# Patient Record
Sex: Female | Born: 1935 | Race: White | Hispanic: No | Smoking: Never smoker
Health system: Southern US, Community
[De-identification: ages and names within clinical notes are randomized; demographics above are authoritative.]

## PROBLEM LIST (undated history)

## (undated) DIAGNOSIS — Z8719 Personal history of other diseases of the digestive system: Secondary | ICD-10-CM

## (undated) DIAGNOSIS — K589 Irritable bowel syndrome without diarrhea: Secondary | ICD-10-CM

## (undated) DIAGNOSIS — F411 Generalized anxiety disorder: Secondary | ICD-10-CM

## (undated) DIAGNOSIS — E7800 Pure hypercholesterolemia, unspecified: Secondary | ICD-10-CM

## (undated) DIAGNOSIS — R9389 Abnormal findings on diagnostic imaging of other specified body structures: Secondary | ICD-10-CM

## (undated) DIAGNOSIS — K449 Diaphragmatic hernia without obstruction or gangrene: Secondary | ICD-10-CM

## (undated) DIAGNOSIS — F0280 Dementia in other diseases classified elsewhere without behavioral disturbance: Secondary | ICD-10-CM

## (undated) DIAGNOSIS — I080 Rheumatic disorders of both mitral and aortic valves: Secondary | ICD-10-CM

## (undated) DIAGNOSIS — I209 Angina pectoris, unspecified: Secondary | ICD-10-CM

## (undated) DIAGNOSIS — D126 Benign neoplasm of colon, unspecified: Secondary | ICD-10-CM

## (undated) DIAGNOSIS — G309 Alzheimer's disease, unspecified: Secondary | ICD-10-CM

## (undated) DIAGNOSIS — I2510 Atherosclerotic heart disease of native coronary artery without angina pectoris: Secondary | ICD-10-CM

## (undated) HISTORY — DX: Rheumatic disorders of both mitral and aortic valves: I08.0

## (undated) HISTORY — DX: Abnormal findings on diagnostic imaging of other specified body structures: R93.89

## (undated) HISTORY — DX: Generalized anxiety disorder: F41.1

## (undated) HISTORY — DX: Atherosclerotic heart disease of native coronary artery without angina pectoris: I25.10

## (undated) HISTORY — DX: Irritable bowel syndrome, unspecified: K58.9

## (undated) HISTORY — DX: Benign neoplasm of colon, unspecified: D12.6

## (undated) HISTORY — DX: Dementia in other diseases classified elsewhere, unspecified severity, without behavioral disturbance, psychotic disturbance, mood disturbance, and anxiety: F02.80

## (undated) HISTORY — PX: CATARACT EXTRACTION W/ INTRAOCULAR LENS IMPLANT: SHX1309

## (undated) HISTORY — DX: Alzheimer's disease, unspecified: G30.9

## (undated) HISTORY — DX: Diaphragmatic hernia without obstruction or gangrene: K44.9

## (undated) HISTORY — DX: Pure hypercholesterolemia, unspecified: E78.00

---

## 1963-05-27 HISTORY — PX: APPENDECTOMY: SHX54

## 1963-05-27 HISTORY — PX: ABDOMINAL HYSTERECTOMY: SHX81

## 1988-11-23 ENCOUNTER — Encounter (INDEPENDENT_AMBULATORY_CARE_PROVIDER_SITE_OTHER): Payer: Self-pay | Admitting: *Deleted

## 1988-11-23 LAB — CONVERTED CEMR LAB

## 1997-10-13 ENCOUNTER — Encounter: Admission: RE | Admit: 1997-10-13 | Discharge: 1997-10-13 | Payer: Self-pay | Admitting: Family Medicine

## 1997-10-17 ENCOUNTER — Encounter: Admission: RE | Admit: 1997-10-17 | Discharge: 1997-10-17 | Payer: Self-pay | Admitting: Sports Medicine

## 1997-12-26 ENCOUNTER — Encounter: Admission: RE | Admit: 1997-12-26 | Discharge: 1997-12-26 | Payer: Self-pay | Admitting: Family Medicine

## 1998-03-27 ENCOUNTER — Encounter: Admission: RE | Admit: 1998-03-27 | Discharge: 1998-03-27 | Payer: Self-pay | Admitting: Family Medicine

## 1998-05-26 DIAGNOSIS — I2510 Atherosclerotic heart disease of native coronary artery without angina pectoris: Secondary | ICD-10-CM

## 1998-05-26 HISTORY — DX: Atherosclerotic heart disease of native coronary artery without angina pectoris: I25.10

## 1998-06-01 ENCOUNTER — Encounter: Admission: RE | Admit: 1998-06-01 | Discharge: 1998-06-01 | Payer: Self-pay | Admitting: Family Medicine

## 1998-06-02 ENCOUNTER — Encounter: Payer: Self-pay | Admitting: *Deleted

## 1998-06-02 ENCOUNTER — Inpatient Hospital Stay (HOSPITAL_COMMUNITY): Admission: EM | Admit: 1998-06-02 | Discharge: 1998-06-05 | Payer: Self-pay | Admitting: *Deleted

## 1998-06-04 HISTORY — PX: CORONARY ANGIOPLASTY WITH STENT PLACEMENT: SHX49

## 1998-06-14 ENCOUNTER — Encounter: Admission: RE | Admit: 1998-06-14 | Discharge: 1998-06-14 | Payer: Self-pay | Admitting: Family Medicine

## 2000-01-29 ENCOUNTER — Encounter: Payer: Self-pay | Admitting: *Deleted

## 2000-01-29 ENCOUNTER — Encounter: Admission: RE | Admit: 2000-01-29 | Discharge: 2000-01-29 | Payer: Self-pay | Admitting: *Deleted

## 2000-02-28 ENCOUNTER — Encounter: Admission: RE | Admit: 2000-02-28 | Discharge: 2000-02-28 | Payer: Self-pay | Admitting: Family Medicine

## 2000-03-10 ENCOUNTER — Encounter: Admission: RE | Admit: 2000-03-10 | Discharge: 2000-03-10 | Payer: Self-pay | Admitting: Family Medicine

## 2000-03-25 ENCOUNTER — Encounter: Admission: RE | Admit: 2000-03-25 | Discharge: 2000-03-25 | Payer: Self-pay | Admitting: Family Medicine

## 2000-05-06 ENCOUNTER — Encounter: Admission: RE | Admit: 2000-05-06 | Discharge: 2000-05-06 | Payer: Self-pay | Admitting: Family Medicine

## 2000-06-11 ENCOUNTER — Encounter: Admission: RE | Admit: 2000-06-11 | Discharge: 2000-06-11 | Payer: Self-pay | Admitting: Family Medicine

## 2000-07-10 ENCOUNTER — Encounter: Admission: RE | Admit: 2000-07-10 | Discharge: 2000-07-10 | Payer: Self-pay | Admitting: Family Medicine

## 2000-08-05 ENCOUNTER — Encounter: Admission: RE | Admit: 2000-08-05 | Discharge: 2000-08-05 | Payer: Self-pay | Admitting: Family Medicine

## 2000-09-04 ENCOUNTER — Encounter: Admission: RE | Admit: 2000-09-04 | Discharge: 2000-09-04 | Payer: Self-pay | Admitting: Family Medicine

## 2000-11-03 ENCOUNTER — Encounter: Admission: RE | Admit: 2000-11-03 | Discharge: 2000-11-03 | Payer: Self-pay | Admitting: Family Medicine

## 2001-02-25 ENCOUNTER — Encounter: Admission: RE | Admit: 2001-02-25 | Discharge: 2001-02-25 | Payer: Self-pay | Admitting: Family Medicine

## 2001-03-25 ENCOUNTER — Encounter: Admission: RE | Admit: 2001-03-25 | Discharge: 2001-03-25 | Payer: Self-pay | Admitting: Family Medicine

## 2001-03-29 ENCOUNTER — Encounter: Payer: Self-pay | Admitting: Sports Medicine

## 2001-03-29 ENCOUNTER — Encounter: Admission: RE | Admit: 2001-03-29 | Discharge: 2001-03-29 | Payer: Self-pay | Admitting: Sports Medicine

## 2001-05-05 ENCOUNTER — Encounter: Admission: RE | Admit: 2001-05-05 | Discharge: 2001-05-05 | Payer: Self-pay | Admitting: Family Medicine

## 2001-05-07 ENCOUNTER — Encounter: Admission: RE | Admit: 2001-05-07 | Discharge: 2001-05-07 | Payer: Self-pay | Admitting: Family Medicine

## 2001-09-21 ENCOUNTER — Encounter: Admission: RE | Admit: 2001-09-21 | Discharge: 2001-09-21 | Payer: Self-pay | Admitting: Family Medicine

## 2001-10-21 ENCOUNTER — Encounter: Admission: RE | Admit: 2001-10-21 | Discharge: 2001-10-21 | Payer: Self-pay | Admitting: Family Medicine

## 2001-10-28 ENCOUNTER — Encounter: Admission: RE | Admit: 2001-10-28 | Discharge: 2001-10-28 | Payer: Self-pay

## 2001-11-22 ENCOUNTER — Encounter: Admission: RE | Admit: 2001-11-22 | Discharge: 2001-11-22 | Payer: Self-pay | Admitting: Family Medicine

## 2001-11-23 ENCOUNTER — Encounter: Admission: RE | Admit: 2001-11-23 | Discharge: 2001-11-23 | Payer: Self-pay | Admitting: Family Medicine

## 2001-12-02 ENCOUNTER — Encounter: Admission: RE | Admit: 2001-12-02 | Discharge: 2001-12-02 | Payer: Self-pay | Admitting: Family Medicine

## 2002-04-04 ENCOUNTER — Encounter: Admission: RE | Admit: 2002-04-04 | Discharge: 2002-04-04 | Payer: Self-pay | Admitting: Family Medicine

## 2002-06-01 ENCOUNTER — Encounter: Admission: RE | Admit: 2002-06-01 | Discharge: 2002-06-01 | Payer: Self-pay | Admitting: Family Medicine

## 2005-05-06 ENCOUNTER — Ambulatory Visit: Payer: Self-pay

## 2005-05-09 ENCOUNTER — Ambulatory Visit: Payer: Self-pay | Admitting: Cardiology

## 2005-05-09 ENCOUNTER — Ambulatory Visit: Payer: Self-pay

## 2005-05-09 ENCOUNTER — Encounter: Payer: Self-pay | Admitting: Cardiology

## 2005-05-14 ENCOUNTER — Ambulatory Visit: Payer: Self-pay

## 2005-05-26 HISTORY — PX: INSERT / REPLACE / REMOVE PACEMAKER: SUR710

## 2005-06-06 ENCOUNTER — Ambulatory Visit: Payer: Self-pay | Admitting: Cardiology

## 2005-07-09 ENCOUNTER — Ambulatory Visit: Payer: Self-pay | Admitting: Internal Medicine

## 2005-07-09 ENCOUNTER — Inpatient Hospital Stay (HOSPITAL_COMMUNITY): Admission: EM | Admit: 2005-07-09 | Discharge: 2005-07-12 | Payer: Self-pay | Admitting: Emergency Medicine

## 2005-07-21 ENCOUNTER — Ambulatory Visit: Payer: Self-pay

## 2005-08-19 ENCOUNTER — Ambulatory Visit: Payer: Self-pay | Admitting: Cardiology

## 2005-09-19 ENCOUNTER — Ambulatory Visit: Payer: Self-pay | Admitting: Cardiology

## 2005-09-30 ENCOUNTER — Ambulatory Visit: Payer: Self-pay | Admitting: Internal Medicine

## 2006-01-22 ENCOUNTER — Ambulatory Visit: Payer: Self-pay | Admitting: Internal Medicine

## 2006-02-03 ENCOUNTER — Ambulatory Visit: Payer: Self-pay | Admitting: Internal Medicine

## 2006-02-03 ENCOUNTER — Encounter: Payer: Self-pay | Admitting: Internal Medicine

## 2006-02-03 DIAGNOSIS — D126 Benign neoplasm of colon, unspecified: Secondary | ICD-10-CM

## 2006-02-03 DIAGNOSIS — K648 Other hemorrhoids: Secondary | ICD-10-CM | POA: Insufficient documentation

## 2006-04-08 ENCOUNTER — Ambulatory Visit: Payer: Self-pay | Admitting: Internal Medicine

## 2006-05-08 ENCOUNTER — Ambulatory Visit: Payer: Self-pay | Admitting: Cardiology

## 2006-07-07 ENCOUNTER — Ambulatory Visit: Payer: Self-pay | Admitting: Internal Medicine

## 2006-07-23 DIAGNOSIS — F411 Generalized anxiety disorder: Secondary | ICD-10-CM | POA: Insufficient documentation

## 2006-07-23 DIAGNOSIS — I2510 Atherosclerotic heart disease of native coronary artery without angina pectoris: Secondary | ICD-10-CM | POA: Insufficient documentation

## 2006-07-23 DIAGNOSIS — K589 Irritable bowel syndrome without diarrhea: Secondary | ICD-10-CM

## 2006-07-23 DIAGNOSIS — E7800 Pure hypercholesterolemia, unspecified: Secondary | ICD-10-CM

## 2006-07-23 DIAGNOSIS — K449 Diaphragmatic hernia without obstruction or gangrene: Secondary | ICD-10-CM | POA: Insufficient documentation

## 2006-07-24 ENCOUNTER — Encounter (INDEPENDENT_AMBULATORY_CARE_PROVIDER_SITE_OTHER): Payer: Self-pay | Admitting: *Deleted

## 2006-07-29 ENCOUNTER — Ambulatory Visit: Payer: Self-pay | Admitting: Internal Medicine

## 2007-01-28 ENCOUNTER — Ambulatory Visit: Payer: Self-pay | Admitting: Internal Medicine

## 2007-02-01 ENCOUNTER — Ambulatory Visit: Payer: Self-pay | Admitting: Internal Medicine

## 2007-03-10 ENCOUNTER — Ambulatory Visit: Payer: Self-pay | Admitting: Internal Medicine

## 2007-05-18 ENCOUNTER — Ambulatory Visit: Payer: Self-pay | Admitting: Internal Medicine

## 2007-07-29 DIAGNOSIS — K5900 Constipation, unspecified: Secondary | ICD-10-CM | POA: Insufficient documentation

## 2007-08-23 ENCOUNTER — Ambulatory Visit: Payer: Self-pay | Admitting: Internal Medicine

## 2007-12-06 ENCOUNTER — Ambulatory Visit: Payer: Self-pay | Admitting: Internal Medicine

## 2008-02-11 ENCOUNTER — Ambulatory Visit: Payer: Self-pay | Admitting: Internal Medicine

## 2008-03-28 ENCOUNTER — Ambulatory Visit: Payer: Self-pay | Admitting: Internal Medicine

## 2008-05-17 ENCOUNTER — Ambulatory Visit: Payer: Self-pay | Admitting: Internal Medicine

## 2008-08-09 ENCOUNTER — Ambulatory Visit: Payer: Self-pay | Admitting: Internal Medicine

## 2008-09-15 ENCOUNTER — Encounter (INDEPENDENT_AMBULATORY_CARE_PROVIDER_SITE_OTHER): Payer: Self-pay | Admitting: Radiology

## 2008-11-13 ENCOUNTER — Encounter: Payer: Self-pay | Admitting: Internal Medicine

## 2008-11-16 ENCOUNTER — Ambulatory Visit: Payer: Self-pay | Admitting: Internal Medicine

## 2008-12-07 ENCOUNTER — Encounter: Payer: Self-pay | Admitting: Internal Medicine

## 2009-01-26 DIAGNOSIS — I080 Rheumatic disorders of both mitral and aortic valves: Secondary | ICD-10-CM

## 2009-01-26 DIAGNOSIS — Z87898 Personal history of other specified conditions: Secondary | ICD-10-CM

## 2009-01-30 ENCOUNTER — Ambulatory Visit: Payer: Self-pay | Admitting: Internal Medicine

## 2009-05-10 ENCOUNTER — Encounter: Payer: Self-pay | Admitting: Internal Medicine

## 2009-05-15 ENCOUNTER — Ambulatory Visit: Payer: Self-pay | Admitting: Internal Medicine

## 2009-05-29 ENCOUNTER — Encounter: Payer: Self-pay | Admitting: Internal Medicine

## 2009-08-07 ENCOUNTER — Emergency Department (HOSPITAL_COMMUNITY)
Admission: EM | Admit: 2009-08-07 | Discharge: 2009-08-07 | Payer: Self-pay | Source: Home / Self Care | Admitting: Emergency Medicine

## 2009-09-26 ENCOUNTER — Encounter: Payer: Self-pay | Admitting: Internal Medicine

## 2009-12-11 ENCOUNTER — Encounter: Payer: Self-pay | Admitting: Internal Medicine

## 2009-12-14 ENCOUNTER — Encounter: Payer: Self-pay | Admitting: Internal Medicine

## 2009-12-25 ENCOUNTER — Encounter: Payer: Self-pay | Admitting: Internal Medicine

## 2010-04-23 ENCOUNTER — Ambulatory Visit: Payer: Self-pay | Admitting: Internal Medicine

## 2010-04-25 DIAGNOSIS — R9389 Abnormal findings on diagnostic imaging of other specified body structures: Secondary | ICD-10-CM

## 2010-04-25 HISTORY — DX: Abnormal findings on diagnostic imaging of other specified body structures: R93.89

## 2010-05-11 ENCOUNTER — Encounter: Payer: Self-pay | Admitting: Internal Medicine

## 2010-05-11 ENCOUNTER — Inpatient Hospital Stay (HOSPITAL_COMMUNITY)
Admission: EM | Admit: 2010-05-11 | Discharge: 2010-05-14 | Payer: Self-pay | Source: Home / Self Care | Attending: Internal Medicine | Admitting: Internal Medicine

## 2010-05-12 ENCOUNTER — Encounter: Payer: Self-pay | Admitting: Internal Medicine

## 2010-05-12 LAB — CONVERTED CEMR LAB
LDL Cholesterol: 113 mg/dL
TSH: 2.009 microintl units/mL
Triglyceride fasting, serum: 53 mg/dL

## 2010-05-13 ENCOUNTER — Encounter (INDEPENDENT_AMBULATORY_CARE_PROVIDER_SITE_OTHER): Payer: Self-pay | Admitting: Internal Medicine

## 2010-05-14 ENCOUNTER — Encounter: Payer: Self-pay | Admitting: Internal Medicine

## 2010-05-14 ENCOUNTER — Encounter (INDEPENDENT_AMBULATORY_CARE_PROVIDER_SITE_OTHER): Payer: Self-pay | Admitting: Internal Medicine

## 2010-05-14 LAB — CONVERTED CEMR LAB
CO2: 26 meq/L
Calcium: 8.8 mg/dL
HCT: 43.2 %
Hemoglobin: 14 g/dL
MCV: 92.3 fL
Platelets: 197 10*3/uL
RBC: 4.68 M/uL
WBC: 5.9 10*3/uL

## 2010-06-14 ENCOUNTER — Encounter: Payer: Self-pay | Admitting: Internal Medicine

## 2010-06-17 ENCOUNTER — Ambulatory Visit
Admission: RE | Admit: 2010-06-17 | Discharge: 2010-06-17 | Payer: Self-pay | Source: Home / Self Care | Attending: Internal Medicine | Admitting: Internal Medicine

## 2010-06-17 DIAGNOSIS — G309 Alzheimer's disease, unspecified: Secondary | ICD-10-CM

## 2010-06-17 DIAGNOSIS — F0280 Dementia in other diseases classified elsewhere without behavioral disturbance: Secondary | ICD-10-CM | POA: Insufficient documentation

## 2010-06-25 NOTE — Letter (Signed)
Summary: Remote Device Check  [employer], Main Office  [address]   [place], [place] [address]   Phone: [phone]  Fax: [phone]     May 29, 2009 MRN: [id number]   Breslin Sigg [address] [place], [place]  [address]   Dear Ms. Jurewicz,   Your remote transmission was recieved and reviewed by your physician.  All diagnostics were within normal limits for you.  __X___Your next transmission is scheduled for:  August 15, 2009.  Please transmit at any time this day.  If you have a wireless device your transmission will be sent automatically.     Sincerely,  Proofreader

## 2010-06-25 NOTE — Letter (Signed)
Summary: Remote Device Check  [employer], Main Office  [address]   [place], [place] [address]   Phone: [phone]  Fax: [phone]     December 25, 2009 MRN: [id number]   Anna [name] [address] [place], [place]  [address]   Dear Ms. Curiale,   Your remote transmission was recieved and reviewed by your physician.  All diagnostics were within normal limits for you.   __X____Your next office visit is scheduled for:   September with Dr Graciela Husbands. Please call our office to schedule an appointment.    Sincerely,  Vella Kohler

## 2010-06-25 NOTE — Letter (Signed)
Summary: Device-Delinquent Phone Journalist, newspaper, Main Office  [address]   [place], [place] [address]   Phone: [phone]  Fax: [phone]     December 11, 2009 MRN: [id number]   Geneal Axford [address] [place], [place]  [address]   Dear Ms. Mccleod,  According to our records, you were scheduled for a device phone transmission on  08-15-09.     We did not receive any results from this check.  If you transmitted on your scheduled day, please call us to help troubleshoot your system.  If you forgot to send your transmission, please send one upon receipt of this letter.  Thank you,   Architectural technologist [facility]

## 2010-06-25 NOTE — Assessment & Plan Note (Signed)
Summary: pc2 medtronic  Medications Added ARICEPT 5 MG TABS (DONEPEZIL HCL) once daily      Allergies Added: NKDA  Visit Type:  PPM-Medtronic  CC:  no complaints.  History of Present Illness:   Anna [name] is seen in followup for pacemaker implanted for sinus node dysfunction some years ago.  Echo 2006 demonstratedv normal left ventricular function  Cardiac Cath 2007 demonstrated no obstructive coronary disease and no problems with the previously implanted LAD stent  She is doing well without cardiovascular complaints. She is a history of coronary artery disease and prior PCI; she had no intercurrent problems with chest pain or shortness of breath  She has had a history of recent falls. These have occurred following standing. They also can occur with abrupt motion but there is no dizziness with turning of the head. Potential medications contributing include Aricept; she does not know her other medications. She will call if with this information.  Problems Prior to Update: 1)  Pacemaker Medtronic Ddd  (ICD-V45.01) 2)  Sinus Node Dysfunction  (ICD-427.81) 3)  Coronary, Arteriosclerosis  (ICD-414.00) 4)  Glaucoma, Hx of  (ICD-V13.8) 5)  Mitral Regurgitation  (ICD-396.3) 6)  Constipation  (ICD-564.00) 7)  Internal Hemorrhoids  (ICD-455.0) 8)  Colonic Polyps, Adenomatous  (ICD-211.3) 9)  Irritable Bowel Syndrome  (ICD-564.1) 10)  Hypercholesterolemia  (ICD-272.0) 11)  Hernia, Hiatal, Noncongenital  (ICD-553.3) 12)  Anxiety  (ICD-300.00)  Current Medications (verified): 1)  Simply Saline 0.9 % Aers (Saline) .... Daily As Directed 2)  Nasonex 50 Mcg/act Susp (Mometasone Furoate) .... Two Sprays Daily 3)  Aricept 5 Mg Tabs (Donepezil Hcl) .... Once Daily  Allergies (verified): No Known Drug Allergies  Past History:  Past Medical History: Last updated: 02/16/2009 Burning Mouth Syndrome, h/o BPV, h/o thrombosed hemorrhoids s/p I&D GLAUCOMA, HX OF (ICD-V13.8) MITRAL  REGURGITATION (ICD-396.3) CONSTIPATION (ICD-564.00) INTERNAL HEMORRHOIDS (ICD-455.0) COLONIC POLYPS, ADENOMATOUS (ICD-211.3) IRRITABLE BOWEL SYNDROME (ICD-564.1) HYPERCHOLESTEROLEMIA (ICD-272.0) HERNIA, HIATAL, NONCONGENITAL (ICD-553.3) CORONARY, ARTERIOSCLEROSIS (ICD-414.00) ANXIETY (ICD-300.00)  Past Surgical History: Last updated: 02-16-09 CT - Head 12/93 -, s/p PTCA and stent - 80% LAD 1/00 -, TAH/BSO 1971 - Appendectomy  Hysterectomy  Pacemaker Implantation  Medtronic Adapta  07/11/2005 Duke Salvia, M.D.    Family History: Last updated: 16-Feb-2009  Mother deceased secondary to myocardial infarction.  Father  died of a stroke.  The patient has 9 siblings, some of whom did die from  heart attacks, according to her daughter.  Social History: Last updated: 07/23/2006 Lives alone, no tob, no etoh.  Retired from 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  Vital Signs:  Patient profile:   77 year old female Height:      63 inches Weight:      129 pounds BMI:     22.93 Pulse rate:   72 / minute BP sitting:   143 / 71  (left arm) Cuff size:   regular  Vitals Entered By: Anna [name] CMA (April 23, 2010 10:46 AM)  Physical Exam  General:  The patient was alert and oriented in no acute distress. HEENT Normal.  Neck veins were flat, carotids were brisk.  Lungs were clear.  Heart sounds were regular without murmurs or gallops.  Abdomen was soft with active bowel sounds. There is no clubbing cyanosis or edema. Skin Warm and  dry    PPM Specifications Following MD:  Sherryl Manges, MD     PPM Vendor:  Medtronic     PPM Model Number:  [id number]     PPM Serial Number:  [id number] PPM DOI:  07/11/2005     PPM Implanting MD:  Sherryl Manges, MD  Lead 1     Location: RA     DOI: 07/11/2005     Model #: [id number]     Serial #: [id number]     Status: active Lead 2    Location: RV     DOI: 07/11/2005     Model #: [id number]     Serial #: [id number]     Status: active   Indications:  SND   PPM Follow Up Battery Voltage:  2.77 V     Battery Est. Longevity:  4.5 yrs     Pacer Dependent:  No       PPM Device Measurements Atrium  Amplitude: 2.80 mV, Impedance: 474 ohms, Threshold: 0.250 V at 0.40 msec Right Ventricle  Amplitude: 11.20 mV, Impedance: 516 ohms, Threshold: 1.00 V at 0.40 msec  Episodes MS Episodes:  38     Percent Mode Switch:  <0.1%     Coumadin:  No Ventricular High Rate:  3     Atrial Pacing:  92.1%     Ventricular Pacing:  0.4%  Parameters Mode:  MVP     Lower Rate Limit:  60     Upper Rate Limit:  130 Paced AV Delay:  200     Sensed AV Delay:  170 Next Remote Date:  07/25/2010     Next Cardiology Appt Due:  03/27/2011 Tech Comments:  38 MODE SWITCHES. 3 VHR EPISODES--1:1 CONDUCTION. NORMAL DEVICE FUNCTION.  CHANGED RA OUTPUT FROM 1.5 TO 2.0 V.  CARELINK TRANSMISSION 07-25-10 AND ROV IN 12 MTHS W/SK.   Impression & Recommendations:  Problem # 1:  ORTHOSTATIC DIZZINESS (ICD-458.0) he has orthostatic dizziness. I have reviewed with her a number of maneuvers to try to promote venous return prior to standing. Notably her symptoms are not particularly bad in the early morning. In the event that these are inadequate, medication adjustments can be considered. Aricept is one potential contributor. We need to get a list of other medications to see if there are any other possible culprits. In the absence of smoking guns, we could consider the addition of ProAmatine.  Problem # 2:  CORONARY, ARTERIOSCLEROSIS (ICD-414.00) no chest pain  Problem # 3:  SINUS NODE DYSFUNCTION (ICD-427.81) Stable with 92% atrial pacing  Problem # 4:  PACEMAKER MEDTRONIC DDD (ICD-V45.01) Device parameters and data were reviewed and no changes were made  Patient  Instructions: 1)  Your physician recommends that you continue on your current medications as directed. Please refer to the Current Medication list given to you today. 2)  Your physician wants you to follow-up in: 1 year  You will receive a reminder letter in the mail two months in advance. If you don't receive a letter, please call our office to schedule the follow-up appointment.

## 2010-06-25 NOTE — Letter (Signed)
Summary: Device-Delinquent Phone Journalist, newspaper, Main Office  [address]   [place], [place] [address]   Phone: [phone]  Fax: [phone]     Sep 26, 2009 MRN: [id number]   Lizbet Viruet [address] [place], [place]  [address]   Dear Ms. Crance,  According to our records, you were scheduled for a device phone transmission on 08-15-2009.     We did not receive any results from this check.  If you transmitted on your scheduled day, please call us to help troubleshoot your system.  If you forgot to send your transmission, please send one upon receipt of this letter.  Thank you,   Architectural technologist [facility]

## 2010-06-25 NOTE — Cardiovascular Report (Signed)
Summary: Office Visit Remote   Office Visit Remote   Imported By: Roderic Ovens 12/28/2009 10:46:39  _____________________________________________________________________  External Attachment:    Type:   Image     Comment:   External Document

## 2010-06-25 NOTE — Cardiovascular Report (Signed)
Summary: Office Visit Remote   Office Visit Remote   Imported By: Roderic Ovens 05/31/2009 12:41:37  _____________________________________________________________________  External Attachment:    Type:   Image     Comment:   External Document

## 2010-06-25 NOTE — Cardiovascular Report (Signed)
Summary: Office Visit   Office Visit   Imported By: Roderic Ovens 04/29/2010 16:21:03  _____________________________________________________________________  External Attachment:    Type:   Image     Comment:   External Document

## 2010-06-27 NOTE — Assessment & Plan Note (Signed)
Summary: New / Medicare / # cd   Vital Signs:  Patient profile:   77 year old female Height:      63 inches (160.02 cm) Weight:      121.12 pounds (55.05 kg) O2 Sat:      93 % on Room air Temp:     98.6 degrees F (37.00 degrees C) oral Pulse rate:   90 / minute BP sitting:   122 / 70  (left arm) Cuff size:   regular  Vitals Entered By: Orlan Leavens RMA (June 17, 2010 1:44 PM)  O2 Flow:  Room air CC: New patient Is Patient Diabetic? No Pain Assessment Patient in pain? no        Primary Care Provider:  Newt Lukes MD  CC:  New patient.  History of Present Illness: new pt to me and our division but known to our group, here to est care  1) syncope - hosp for same 04/2010 - dc summary reviewed - cardio and neuro eval and imaging unremarkable -- med changes reviewed (decrease in centrally acting BZ doses) - no recurrence - no HA or CP  2) memory loss - mild-mod dementia, progressive symptoms despite aricept -improved with dc of klonopin and dtr feels pt is doing "fine" - declines neuro eval or further testing but agreeable to inc in meds if it will help same  3) CAD hx - BMS to prox LAD 05/2009 - reports compliance with ongoing medical treatment and no changes in medication dose or frequency. denies adverse side effects related to current therapy. no CP or angina symptoms   4) dyslipidemia - not on rx med for same - follows low fat diet (supplied by family)  5) IBS hx, constip predom - constant concern to pt - reports no OP med tx helps - no abd pain or rectal bleeding - no weight loss or diarrhea    Preventive Screening-Counseling & Management  Alcohol-Tobacco     Alcohol drinks/day: 0     Alcohol Counseling: not indicated; patient does not drink     Smoking Status: never     Tobacco Counseling: not indicated; no tobacco use  Caffeine-Diet-Exercise     Does Patient Exercise: no     Exercise Counseling: to improve exercise regimen     Depression Counseling:  not indicated; screening negative for depression  Safety-Violence-Falls     Seat Belt Counseling: not indicated; patient wears seat belts     Firearm Counseling: not applicable     Violence Counseling: not indicated; no violence risk noted     Fall Risk Counseling: counseling provided; falls with injury noted  Clinical Review Panels:  Prevention   Last Mammogram:  Done. (03/26/2001)   Last Pap Smear:  Done. (11/23/1988)   Last Colonoscopy:  Location:  [facility].  Results: Polyp.  Results: Hemorrhoids.      (02/03/2006)  Immunizations   Last Tetanus Booster:  Done. (05/26/1994)   Last Pneumovax:  Done. (05/27/1995)  Lipid Management   Cholesterol:  189 (05/12/2010)   LDL (bad choesterol):  113 (05/12/2010)   HDL (good cholesterol):  65 (05/12/2010)   Triglycerides:  53 (05/12/2010)  Diabetes Management   HgBA1C:  5.3 (05/11/2010)   Creatinine:  0.78 (05/14/2010)   Last Pneumovax:  Done. (05/27/1995)  CBC   WBC:  5.9 (05/14/2010)   RBC:  4.68 (05/14/2010)   Hgb:  14.0 (05/14/2010)   Hct:  43.2 (05/14/2010)   Platelets:  197 (05/14/2010)   MCV  92.3 (05/14/2010)   RDW  14.1 (05/14/2010)  Complete Metabolic Panel   Glucose:  95 (05/14/2010)   Sodium:  143 (05/14/2010)   Potassium:  3.8 (05/14/2010)   Chloride:  109 (05/14/2010)   CO2:  26 (05/14/2010)   BUN:  11 (05/14/2010)   Creatinine:  0.78 (05/14/2010)   Calcium:  8.8 (05/14/2010)   Current Medications (verified): 1)  Aricept 5 Mg Tabs (Donepezil Hcl) .... Once Daily 2)  Aspirin 81 Mg Tabs (Aspirin) .... Take 1 By Mouth Once Daily 3)  Restoril 7.5 Mg Caps (Temazepam) .... Take 1 At Bedtime 4)  Tranxene-T 3.75 Mg Tabs (Clorazepate Dipotassium) .... Take 1 Every Other Day 5)  One Daily For Women 50+ Adv  Tabs (Multiple Vitamins-Minerals) .... Take 1 By Mouth Once Daily  Allergies (verified): 1)  ! Valium  Past History:  Past Medical History: Burning Mouth Syndrome, h/o BPV, h/o  thrombosed hemorrhoids s/p I&D GLAUCOMA, HX MITRAL REGURGITATION COLONIC POLYPS, ADENOMATOUS  IRRITABLE BOWEL SYNDROME -C HYPERCHOLESTEROLEMIA CORONARY, ARTERIOSCLEROSIS  - BMS prox LAD 05/2009 SSS s/p PPM 06/2005 ANXIETY dementia  MD roster: card - [facility] - brodie  Past Surgical History: CT - Head 12/93 -,  s/p PTCA and stent - 80% LAD 05/1998 -,  TAH/BSO 1971 - Appendectomy Pacemaker Implantation  Medtronic Adapta  07/11/2005 Duke Salvia, M.D.    Family History:  Mother deceased secondary to myocardial infarction.   Father died of a stroke.   The patient has 9 siblings, some of whom died from heart attacks, according to her daughter.  Social History: Lives alone, no tob, no etoh.   Retired from [employer]. Divorced; husband was alcoholic and abandoned her 1950's. 4 children: son in [place], son staying with her intermittently, dtr RN in [place], dtr RN Kristie Cowman cares for child with Down's full-time; Claris Che is designated Management consultant.Smoking Status:  never Does Patient Exercise:  no  Review of Systems       see HPI above. I have reviewed all other systems and they were negative.   Physical Exam  General:  frail older woman, dtr at side - alert, well-developed, well-nourished, and cooperative to examination.    Head:  Normocephalic and atraumatic without obvious abnormalities. No apparent alopecia or balding. Eyes:  vision grossly intact; pupils equal, round and reactive to light.  conjunctiva and lids normal.    Ears:  R ear normal and L ear normal.   Mouth:  teeth and gums in good repair; mucous membranes moist, without lesions or ulcers. oropharynx clear without exudate, no erythema.  Lungs:  normal respiratory effort, no intercostal retractions or use of accessory muscles; normal breath sounds bilaterally - no crackles and no wheezes.    Heart:  normal rate, regular rhythm, no murmur, and no rub. BLE without edema. Abdomen:  soft,  non-tender, normal bowel sounds, no distention; no masses and no appreciable hepatomegaly or splenomegaly.   Neurologic:  alert & oriented X2 and cranial nerves II-XII symetrically intact.  strength normal in all extremities, sensation intact to light touch, and gait normal. speech fluent without dysarthria or aphasia; follows commands with good comprehension.  short term recall 1/3 at 3 minutes Psych:  Oriented X3, memory intact for recent and remote, normally interactive, good eye contact, not anxious appearing, not depressed appearing, and not agitated.   repetitive conversation re: bowels   Impression & Recommendations:  Problem # 1:  ALZHEIMERS DISEASE (ICD-331.0)  progressive memory loss with age - dtr declines further  testing or neuro eval -  improved with dc of klonopin -  will inc doze aricept and consider addl med as needed - erx done pt with 24/7 supervision at her home and no longer driving - commended family for same -  Orders: Prescription Created Electronically [phone])  Problem # 2:  IRRITABLE BOWEL SYNDROME (ICD-564.1) chronic, constipation predom - rec diet changes and miralax daily - prior GI eval reviewed - f/u brodie as needed - exam and hx benign  Problem # 3:  CORONARY, ARTERIOSCLEROSIS (ICD-414.00)  BMS 05/2009 to LAD - cont ASA and med tx - no active symptoms   Her updated medication list for this problem includes:    Aspirin 81 Mg Tabs (Aspirin) .Marland Kitchen... Take 1 by mouth once daily  Labs Reviewed: Chol: 189 (05/12/2010)   HDL: 65 (05/12/2010)   LDL: 113 (05/12/2010)   TG: 53 (05/12/2010)  Problem # 4:  SINUS NODE DYSFUNCTION (ICD-427.81)  Her updated medication list for this problem includes:    Aspirin 81 Mg Tabs (Aspirin) .Marland Kitchen... Take 1 by mouth once daily  Stable with 92% atrial pacing s/p PPM 2007 - EP f/u as needed   Echocardiogram:   SUMMARY   -  Overall left ventricular systolic function was normal. Left         ventricular ejection fraction was  estimated , range being 55         % to 65 %.. There were no left ventricular regional wall         motion abnormalities.   -  Aortic valve thickness was mildly increased. The aortic valve was         mildly calcified.   -  There was mild mitral valvular regurgitation.   -  The estimated peak pulmonary artery systolic pressure was mildly         increased.    ---------------------------------------------------------------   Prepared and Electronically Authenticated by   Olga Millers M.D.   Confirmed 09-May-2005 11:34:26 (05/09/2005)  Labs Reviewed: Na: 143 (05/14/2010)   K+: 3.8 (05/14/2010)   CL: 109 (05/14/2010)   HCO3: 26 (05/14/2010) Ca: 8.8 (05/14/2010)   TSH: 2.009 (05/12/2010)   HCO3: 26 (05/14/2010)  Complete Medication List: 1)  Aricept 10 Mg Tabs (Donepezil hcl) .Marland Kitchen.. 1 by mouth once daily 2)  Aspirin 81 Mg Tabs (Aspirin) .... Take 1 by mouth once daily 3)  Restoril 7.5 Mg Caps (Temazepam) .... Take 1 at bedtime 4)  Tranxene-t 3.75 Mg Tabs (Clorazepate dipotassium) .... Take 1 every other day 5)  One Daily For Women 50+ Adv Tabs (Multiple vitamins-minerals) .... Take 1 by mouth once daily  Patient Instructions: 1)  it was good to see you today. 2)  medications and history reviewed - 3)  use miralax 1-2x/day for bowels - also eat dried prunes and lots of fruit 4)  increase aricept dose for memory - your prescription has been electronically submitted to your pharmacy. Please take as directed. Contact our office if you believe you're having problems with the medication(s).  5)  Please schedule a follow-up appointment in 3-4 months to review memory, call sooner if problems.  Prescriptions: ARICEPT 10 MG TABS (DONEPEZIL HCL) 1 by mouth once daily  #30 x 6   Entered and Authorized by:   Newt Lukes MD   Signed by:   Newt Lukes MD on 06/17/2010   Method used:   Electronically to        [facility]* (retail)  [address]  [address] [place], [place]  [address]       Ph: [phone] or [phone]       Fax: [phone]   RxID:   [id number]    Orders Added: 1)  New Patient Level III [99203] 2)  Prescription Created Electronically [[id number]]     Colonoscopy  Procedure date:  02/03/2006  Findings:      Location:  [facility].  Results: Polyp.  Results: Hemorrhoids.

## 2010-07-28 ENCOUNTER — Encounter (INDEPENDENT_AMBULATORY_CARE_PROVIDER_SITE_OTHER): Payer: Self-pay | Admitting: *Deleted

## 2010-08-05 LAB — COMPREHENSIVE METABOLIC PANEL
Albumin: 3.7 g/dL (ref 3.5–5.2)
Creatinine, Ser: 0.84 mg/dL (ref 0.4–1.2)
GFR calc Af Amer: >60 mL/min (ref >60)
GFR calc non Af Amer: >60 mL/min (ref >60)
Glucose, Bld: 107 mg/dL — ABNORMAL HIGH (ref 70–99)
Sodium: 140 mEq/L (ref 135–145)
Total Bilirubin: 1.2 mg/dL (ref 0.3–1.2)

## 2010-08-05 LAB — CBC
HCT: 37.9 % (ref 36.0–46.0)
HCT: 39 % (ref 36.0–46.0)
HCT: 45.5 % (ref 36.0–46.0)
Hemoglobin: 12.1 g/dL (ref 12.0–15.0)
Hemoglobin: 15.3 g/dL — ABNORMAL HIGH (ref 12.0–15.0)
MCH: 29.4 pg (ref 26.0–34.0)
MCHC: 33.1 g/dL (ref 30.0–36.0)
MCHC: 33.6 g/dL (ref 30.0–36.0)
MCV: 90.5 fL (ref 78.0–100.0)
MCV: 90.8 fL (ref 78.0–100.0)
MCV: 92.2 fL (ref 78.0–100.0)
MCV: 92.3 fL (ref 78.0–100.0)
Platelets: 171 10*3/uL (ref 150–400)
Platelets: 185 10*3/uL (ref 150–400)
Platelets: 197 10*3/uL (ref 150–400)
RBC: 4.11 MIL/uL (ref 3.87–5.11)
RBC: 5.01 MIL/uL (ref 3.87–5.11)
RDW: 13.6 % (ref 11.5–15.5)
RDW: 14.1 % (ref 11.5–15.5)
WBC: 4.6 10*3/uL (ref 4.0–10.5)
WBC: 5.9 10*3/uL (ref 4.0–10.5)
WBC: 6.1 10*3/uL (ref 4.0–10.5)

## 2010-08-05 LAB — URINE MICROSCOPIC-ADD ON

## 2010-08-05 LAB — URINALYSIS, ROUTINE W REFLEX MICROSCOPIC
Bilirubin Urine: NEGATIVE
Glucose, UA: NEGATIVE mg/dL
Leukocytes, UA: NEGATIVE
Nitrite: NEGATIVE
Protein, ur: 100 mg/dL — AB
Urobilinogen, UA: 0.2 mg/dL (ref 0.0–1.0)
pH: 7 (ref 5.0–8.0)

## 2010-08-05 LAB — DIFFERENTIAL
Basophils Relative: 0 % (ref 0–1)
Eosinophils Relative: 0 % (ref 0–5)
Lymphocytes Relative: 9 % — ABNORMAL LOW (ref 12–46)
Lymphs Abs: 0.6 10*3/uL — ABNORMAL LOW (ref 0.7–4.0)
Monocytes Absolute: 0.2 10*3/uL (ref 0.1–1.0)
Monocytes Relative: 3 % (ref 3–12)

## 2010-08-05 LAB — HEMOGLOBIN A1C
Hgb A1c MFr Bld: 5.3 % (ref <5.7)
Mean Plasma Glucose: 105 mg/dL (ref <117)

## 2010-08-05 LAB — BASIC METABOLIC PANEL
BUN: 11 mg/dL (ref 6–23)
CO2: 26 mEq/L (ref 19–32)
CO2: 26 mEq/L (ref 19–32)
Chloride: 108 mEq/L (ref 96–112)
Chloride: 109 mEq/L (ref 96–112)
Chloride: 112 mEq/L (ref 96–112)
Creatinine, Ser: 0.78 mg/dL (ref 0.4–1.2)
Creatinine, Ser: 0.8 mg/dL (ref 0.4–1.2)
Creatinine, Ser: 0.84 mg/dL (ref 0.4–1.2)
GFR calc Af Amer: >60 mL/min (ref >60)
GFR calc Af Amer: >60 mL/min (ref >60)
GFR calc Af Amer: >60 mL/min (ref >60)
GFR calc non Af Amer: >60 mL/min (ref >60)
GFR calc non Af Amer: >60 mL/min (ref >60)
Potassium: 2.8 mEq/L — ABNORMAL LOW (ref 3.5–5.1)
Potassium: 3.7 mEq/L (ref 3.5–5.1)
Potassium: 3.8 mEq/L (ref 3.5–5.1)
Sodium: 142 mEq/L (ref 135–145)
Sodium: 143 mEq/L (ref 135–145)

## 2010-08-05 LAB — CK TOTAL AND CKMB (NOT AT ARMC)
CK, MB: 11 ng/mL (ref 0.3–4.0)
Relative Index: 0.8 (ref 0.0–2.5)
Total CK: 1501 U/L — ABNORMAL HIGH (ref 7–177)

## 2010-08-05 LAB — RAPID URINE DRUG SCREEN, HOSP PERFORMED
Amphetamines: NOT DETECTED
Barbiturates: NOT DETECTED

## 2010-08-05 LAB — LIPID PANEL
Triglycerides: 53 mg/dL (ref <150)
VLDL: 11 mg/dL (ref 0–40)

## 2010-08-05 LAB — CARDIAC PANEL(CRET KIN+CKTOT+MB+TROPI)
Total CK: 1378 U/L — ABNORMAL HIGH (ref 7–177)
Troponin I: 0.08 ng/mL — ABNORMAL HIGH (ref 0.00–0.06)

## 2010-08-05 LAB — TSH: TSH: 2.009 u[IU]/mL (ref 0.350–4.500)

## 2010-08-05 LAB — TROPONIN I: Troponin I: 0.09 ng/mL — ABNORMAL HIGH (ref 0.00–0.06)

## 2010-08-06 NOTE — Letter (Signed)
Summary: Device-Delinquent Phone Journalist, newspaper, Main Office  [address]   [place], [place] [address]   Phone: [phone]  Fax: [phone]     July 28, 2010 MRN: [id number]   Anna [name] [address] [place], [place]  [address]   Dear Ms. Flood,  According to our records, you were scheduled for a device phone transmission on 07-25-10.    We did not receive any results from this check.  If you transmitted on your scheduled day, please call us to help troubleshoot your system.  If you forgot to send your transmission, please send one upon receipt of this letter.  Thank you,   Architectural technologist [facility]

## 2010-08-14 ENCOUNTER — Encounter: Payer: Self-pay | Admitting: *Deleted

## 2010-08-18 LAB — URINE MICROSCOPIC-ADD ON

## 2010-08-18 LAB — COMPREHENSIVE METABOLIC PANEL
ALT: 11 U/L (ref 0–35)
AST: 20 U/L (ref 0–37)
Albumin: 3.4 g/dL — ABNORMAL LOW (ref 3.5–5.2)
CO2: 27 mEq/L (ref 19–32)
Calcium: 8.8 mg/dL (ref 8.4–10.5)
GFR calc Af Amer: >60 mL/min (ref >60)
GFR calc non Af Amer: >60 mL/min (ref >60)
Sodium: 140 mEq/L (ref 135–145)
Total Protein: 6.1 g/dL (ref 6.0–8.3)

## 2010-08-18 LAB — DIFFERENTIAL
Eosinophils Absolute: 0 10*3/uL (ref 0.0–0.7)
Eosinophils Relative: 1 % (ref 0–5)
Lymphs Abs: 0.8 10*3/uL (ref 0.7–4.0)
Monocytes Absolute: 0.3 10*3/uL (ref 0.1–1.0)
Monocytes Relative: 6 % (ref 3–12)

## 2010-08-18 LAB — URINALYSIS, ROUTINE W REFLEX MICROSCOPIC
Bilirubin Urine: NEGATIVE
Glucose, UA: NEGATIVE mg/dL
Hgb urine dipstick: NEGATIVE
Ketones, ur: NEGATIVE mg/dL
Protein, ur: NEGATIVE mg/dL
pH: 6 (ref 5.0–8.0)

## 2010-08-18 LAB — POCT CARDIAC MARKERS
CKMB, poc: <1 ng/mL — ABNORMAL LOW (ref 1.0–8.0)
Myoglobin, poc: 71.6 ng/mL (ref 12–200)

## 2010-08-18 LAB — CBC
MCHC: 34 g/dL (ref 30.0–36.0)
Platelets: 212 10*3/uL (ref 150–400)
RBC: 4.18 MIL/uL (ref 3.87–5.11)

## 2010-08-21 ENCOUNTER — Ambulatory Visit (INDEPENDENT_AMBULATORY_CARE_PROVIDER_SITE_OTHER): Payer: Medicare Other | Admitting: *Deleted

## 2010-08-21 DIAGNOSIS — I459 Conduction disorder, unspecified: Secondary | ICD-10-CM

## 2010-08-21 NOTE — Progress Notes (Signed)
Remote pacer check  

## 2010-09-03 ENCOUNTER — Emergency Department (HOSPITAL_COMMUNITY)
Admission: EM | Admit: 2010-09-03 | Discharge: 2010-09-03 | Disposition: A | Discharge status: Home / Self Care | Payer: Medicare Other | Attending: Emergency Medicine | Admitting: Emergency Medicine

## 2010-09-03 ENCOUNTER — Emergency Department (HOSPITAL_COMMUNITY): Payer: Medicare Other

## 2010-09-03 DIAGNOSIS — Y929 Unspecified place or not applicable: Secondary | ICD-10-CM | POA: Insufficient documentation

## 2010-09-03 DIAGNOSIS — I2510 Atherosclerotic heart disease of native coronary artery without angina pectoris: Secondary | ICD-10-CM | POA: Insufficient documentation

## 2010-09-03 DIAGNOSIS — Z950 Presence of cardiac pacemaker: Secondary | ICD-10-CM | POA: Insufficient documentation

## 2010-09-03 DIAGNOSIS — Z79899 Other long term (current) drug therapy: Secondary | ICD-10-CM | POA: Insufficient documentation

## 2010-09-03 DIAGNOSIS — S60229A Contusion of unspecified hand, initial encounter: Secondary | ICD-10-CM | POA: Insufficient documentation

## 2010-09-03 DIAGNOSIS — L02519 Cutaneous abscess of unspecified hand: Secondary | ICD-10-CM | POA: Insufficient documentation

## 2010-09-03 DIAGNOSIS — M7989 Other specified soft tissue disorders: Secondary | ICD-10-CM | POA: Insufficient documentation

## 2010-09-03 DIAGNOSIS — F0390 Unspecified dementia without behavioral disturbance: Secondary | ICD-10-CM | POA: Insufficient documentation

## 2010-09-03 DIAGNOSIS — W108XXA Fall (on) (from) other stairs and steps, initial encounter: Secondary | ICD-10-CM | POA: Insufficient documentation

## 2010-09-03 DIAGNOSIS — IMO0002 Reserved for concepts with insufficient information to code with codable children: Secondary | ICD-10-CM | POA: Insufficient documentation

## 2010-09-03 DIAGNOSIS — Z7982 Long term (current) use of aspirin: Secondary | ICD-10-CM | POA: Insufficient documentation

## 2010-09-04 NOTE — Op Note (Signed)
  [name], [name]               ACCOUNT NO.:  [id number]  MEDICAL RECORD NO.:  [id number]           PATIENT TYPE:  E  LOCATION:  [facility]                         FACILITY:  [facility]  PHYSICIAN:  Dionne Ano. Ernisha Sorn, M.D.DATE OF BIRTH:  15-Aug-1919  DATE OF PROCEDURE: DATE OF DISCHARGE:  09/03/2010                              OPERATIVE REPORT   Anna [name] was seen in [facility] Emergency Room.  She underwent full consultation in the emergency room department.  Following this, the patient was consented for I&D of the hand.  A written H&P has been performed.  PROCEDURE NOTE:  The patient was counseled and taken to the procedure suite.  She underwent a field block followed by I&D of skin, subcutaneous tissue, muscle and tendon.  This was an excisional debridement.  She had an inclusion cyst which was excised as well as cultures which appeared to be significant for bacterial origin infection.  She has cellulitis and edema.  We I&D'd the wound aggressively.  This was a deep abscess I&D and removal of a mass consisting of an inclusion cyst.  Following this she was packed with wet- to-dry Neosporin followed by gauze and a volar plaster splint was applied.  We asked her to see [place] in the office tomorrow at 3:30 p.m. for dressing change and will begin wet-to-dry dressing application to try to arrange therapy.  I have discussed with the patient these issues at length, these notes etc.  We will await her cultures, continue antibiotics.  She also had abrasions to her knees which was noted that there was no focal defects and she appeared to ambulate and had a normal straight leg raise.  We will monitor condition closely and see how she progresses. Given her age and activity level, I want to keep close eye on her as she is 77 years of age.  She is here with the daughter who is very helpful and we appreciate this.  These notes discussed and all questions encouraged and answered.     Dionne Ano. Amanda Pea, M.D.     [facility]  D:  09/03/2010  T:  09/04/2010  Job:  [id number]  Electronically Signed by Dominica Severin M.D. on 09/04/2010 12:39:01 PM

## 2010-09-08 LAB — CULTURE, ROUTINE-ABSCESS

## 2010-09-20 ENCOUNTER — Ambulatory Visit (INDEPENDENT_AMBULATORY_CARE_PROVIDER_SITE_OTHER): Payer: Medicare Other | Admitting: Internal Medicine

## 2010-09-20 ENCOUNTER — Encounter: Payer: Self-pay | Admitting: Internal Medicine

## 2010-09-20 ENCOUNTER — Ambulatory Visit: Payer: Self-pay | Admitting: Internal Medicine

## 2010-09-20 DIAGNOSIS — F0280 Dementia in other diseases classified elsewhere without behavioral disturbance: Secondary | ICD-10-CM

## 2010-09-20 DIAGNOSIS — F411 Generalized anxiety disorder: Secondary | ICD-10-CM

## 2010-09-20 DIAGNOSIS — G309 Alzheimer's disease, unspecified: Secondary | ICD-10-CM

## 2010-09-20 MED ORDER — DONEPEZIL HCL 10 MG PO TABS: 10.0000 mg | ORAL_TABLET | Freq: Every day | ORAL | Status: DC

## 2010-09-20 NOTE — Progress Notes (Signed)
Subjective:    Patient ID: Anna [name], female    DOB: 02/13/1920, 77 y.o.   MRN: [id number]  HPI  Here for follow up - reviewed chronic medical issues today:  Hx syncope - hosp for same 04/2010 - dc summary reviewed - cardio and neuro eval and imaging unremarkable -- med changes reviewed (decrease in centrally acting BZ doses) - no recurrence - no HA or CP  memory loss - mild-mod dementia, progressive symptoms despite aricept -improved with dc of klonopin and dtr feels pt is doing "fine" - declines neuro eval or further testing but increased Aricept dose 05/2010  CAD hx - BMS to prox LAD 05/2009 - reports compliance with ongoing medical treatment and no changes in medication dose or frequency. denies adverse side effects related to current therapy. no CP or angina symptoms   dyslipidemia - not on rx med for same - follows low fat diet (supplied by family)  IBS hx, constip predom - constant concern to pt - reports no OP med tx helps - no abd pain or rectal bleeding - no weight loss or diarrhea  RUL nodule on CT chest 04/2010 - family declines further evaluation  Past Medical History  Diagnosis Date  . COLONIC POLYPS, ADENOMATOUS   . MITRAL REGURGITATION   . CORONARY, ARTERIOSCLEROSIS   . INTERNAL HEMORRHOIDS   . Irritable bowel syndrome   . ALZHEIMERS DISEASE   . ANXIETY   . HYPERCHOLESTEROLEMIA   . HERNIA, HIATAL, NONCONGENITAL     Review of Systems  Constitutional: Negative for fever.  Respiratory: Negative for cough and shortness of breath.   Cardiovascular: Negative for chest pain and palpitations.  Neurological: Negative for syncope and headaches.       Objective:   Physical Exam  Constitutional: She appears well-developed and well-nourished. No distress.  Cardiovascular: Normal rate, regular rhythm and normal heart sounds.   Pulmonary/Chest: Effort normal and breath sounds normal. No respiratory distress.  Neurological: She is alert. No cranial nerve deficit.    Psychiatric: She has a normal mood and affect. Her behavior is normal. Thought content normal.   BP 110/62  Pulse 98  Temp(Src) 97.5 F (36.4 C) (Oral)  Ht 5\' 3"  (1.6 m)  Wt 115 lb 1.9 oz (52.218 kg)  BMI 20.39 kg/m2  SpO2 98% Lab Results  Component Value Date   WBC 5.9 05/14/2010   HGB 14.0 05/14/2010   HCT 43.2 05/14/2010   PLT 197 05/14/2010   CHOL  Value: 189        ATP III CLASSIFICATION:  <200     mg/dL   Desirable  409-811  mg/dL   Borderline High  >=914    mg/dL   High        78/29/5621   TRIG 53 05/12/2010   HDL 65 05/12/2010   ALT 14 05/11/2010   AST 34 05/11/2010   NA 143 05/14/2010   K 3.8 05/14/2010   CL 109 05/14/2010   CREATININE 0.78 05/14/2010   BUN 11 05/14/2010   CO2 26 05/14/2010   TSH 2.009 05/12/2010   INR 1.00 05/11/2010   HGBA1C  Value: 5.3 (NOTE)  According to the ADA Clinical Practice Recommendations for 2011, when HbA1c is used as a screening test:   >=6.5%   Diagnostic of Diabetes Mellitus           (if abnormal result  is confirmed)  5.7-6.4%   Increased risk of developing Diabetes Mellitus  References:Diagnosis and Classification of Diabetes Mellitus,Diabetes Care,2011,34(Suppl 1):S62-S69 and Standards of Medical Care in         Diabetes - 2011,Diabetes Care,2011,34  (Suppl 1):S11-S61. 05/11/2010       Assessment & Plan:  See problem list. Medications and labs reviewed today.

## 2010-09-20 NOTE — Patient Instructions (Signed)
It was good to see you today. We have reviewed your prior records including labs and tests today Increase dose Aricept for memory - Your prescription(s) have been submitted to your pharmacy. Please take as directed and contact our office if you believe you are having problem(s) with the medication(s). Medications reviewed, no other changes at this time. Please schedule followup in 3-4 months, call sooner if problems.

## 2010-09-20 NOTE — Assessment & Plan Note (Signed)
Has not yet increased dose of aricept as rx'd 05/2010- new erx done today for max dose Family supportive and involved, attentive to support and comfort rather than aggressive eval

## 2010-09-20 NOTE — Assessment & Plan Note (Signed)
The current medical regimen is effective;  continue present plan and medications.  

## 2010-10-08 NOTE — Assessment & Plan Note (Signed)
[facility]                         GASTROENTEROLOGY OFFICE NOTE   NAME:[name], Anna Elbert Ewings                      MRN:          [id number]  DATE:03/10/2007                            DOB:          01-13-1920    Anna [name] is an 77 year old white female whom we have been seeing for  chronic constipation. She has had difficult evacuation and hard stools  which she has to evacuate manually. We have done colonoscopy on her in  the past. Last examination was September 2007, which showed adenomatous  polyp of the cecum. She has some rectal stenosis, which has been part of  the problem with causing part of the problem with evacuation. She was on  MiraLax in the past, but currently is taking Milk of Magnesia 30-45 cc  daily with good results. She also takes enemas to evacuate the retained  stool from the rectum. She also uses manual pressure. The patient also  has been having issues with her eyes and is seeing Dr.  Doylene Canning and with  her ears and seeing her ear, nose and throat specialist. She is very  satisfied with her pacemaker followed by Dr.  Graciela Husbands. Last appointment  was July 07, 2006.   MEDICATIONS:  None.   PHYSICAL EXAMINATION:  Blood pressure 118/72, pulse 84 and weight is 125  pounds. She was alert, oriented and in no distress.  LUNGS:  Clear to auscultation.  COR: With normal S1, normal S2.  ABDOMEN: Soft. Nontender. With normoactive bowel sounds.  RECTAL: Shows somewhat tight rectal sphincter. Empty rectal ampulla with  hemoccult negative stool. There was no prolapse. No internal or external  hemorrhoids.   IMPRESSION:  An 77 year old white female with functional constipation  and rectocele. She is managing it well with milk of magnesia and enemas.   PLAN:  Continue the same. I would be happy to see her in the future to  discuss laxative regimen.     Hedwig Morton. Juanda Chance, MD  Electronically Signed    DMB/MedQ  DD: 03/10/2007  DT: 03/10/2007  Job  #: [id number]   cc:   Aida Puffer  Duke Salvia, MD, [facility]

## 2010-10-08 NOTE — Letter (Signed)
January 28, 2007    Dr. Aida Puffer  [address]  [place], [place] [address]   RE:  ARWYN, BESAW  MRN:  [id number]  /  DOB:  August 01, 1919   Dear Dr. Clarene Duke,   Ms. Ahlers comes in today at your request because of episodes of  dizziness.  The dizziness appears to be somewhat positional and has  occurred concomitantly with her being poorly able to hear.  It is not  associated with a sense of spinning, however.  It has been intermittent  and nonpositional.   She is also complaining of a burning sensation in her mouth and in her  lips and a fullness in her ears.  She saw somebody for her ears, who put  her on eardrops.  She also complains of having her ears plugged up and  when she yawns, her ears pop and get plugged up again.   She is not on any medications right now except for the ear drops.   PHYSICAL EXAMINATION:  Blood pressure is 140/70.  Her pulse was 72.  LUNGS:  Clear.  CARDIAC:  Heart sounds were regular.  Her ears had no cerumen in them.  EXTREMITIES:  Without edema.   Interrogation of her pacemaker demonstrated a normal pacemaker function.  There were a couple of episodes of atrial and ventricular high rates.  They do not appear to be frequent enough nor temporally correlating with  her symptoms.  She does have atrial fibrillation with a rapid  ventricular response, but the total percentage of time of mode switch is  less than 0.1%.  She did have an episode that lasted longer than four  hours, but this was four months ago.   IMPRESSION:  1. Sinus node dysfunction.  2. Status post pacemaker.  3. Paroxysmal atrial fibrillation.  4. Dizziness, question mechanism.  5. Difficulty hearing, question related to #4.   Dr. Clarene Duke, I am not quite sure what causes Ms. Eisenmenger's symptoms;  however, I do not think they are cardiac in origin.   Because of her stomatitis-like symptoms, I have asked if we can get her  up to see Dr. Lina Sar.   Thank you very  much for asking [place] to help in her care.    Sincerely,      Duke Salvia, MD, [facility]  Electronically Signed    SCK/MedQ  DD: 01/28/2007  DT: 01/29/2007  Job #: [id number]

## 2010-10-08 NOTE — Assessment & Plan Note (Signed)
[facility]                         ELECTROPHYSIOLOGY OFFICE NOTE   [name], Anna [name]                      MRN:          [id number]  DATE:02/11/2008                            DOB:          [birth year]    Anna [name] is seen in followup for sinus node dysfunction.  She is  status post pacemaker implantation in the setting of normal left  ventricular function.  She also has coronary artery disease with prior  PCI.  She is doing fine without complaints of chest pain or shortness of  breath.   On examination, her blood pressure today was 130/78, her pulse was 88.  The lungs were clear.  The heart sounds were regular.  The extremities  were without edema.   Interrogation of Medtronic pulse generator demonstrates that the P-wave  was 4, the impedance of 78, threshold of 0.5 at 0.4, the R-wave was 8  with impedance of 560 and threshold of 0.12 at 0.4.  Battery voltage was  2.78.   IMPRESSION:  1. Sinus node dysfunction.  2. Status post pacer for sinus node dysfunction.  3. Ischemic heart disease with prior percutaneous coronary      intervention.   Anna [name] is stable.  We will see her again in 1 year's time.     Duke Salvia, MD, [facility]  Electronically Signed    SCK/MedQ  DD: 02/11/2008  DT: 02/12/2008  Job #: [id number]

## 2010-10-11 NOTE — Assessment & Plan Note (Signed)
[facility]                           GASTROENTEROLOGY OFFICE NOTE   NAME:[name], [name]                      MRN:          [id number]  DATE:01/22/2006                            DOB:          1919/11/26    Anna [name] is a very nice 77 year old white female who is here today for  evaluation of constipation.  She describes difficult bowel movements every 3  or 4 days, no blood.  She has some right lower quadrant abdominal pain which  is usually relieved by having a bowel movement.  She does not want to take  laxatives.  She has been tried on stool softeners without improvement.  GlycoLax, which has been started recently, causes her incontinence and  diarrhea.  She has no family history of colon cancer, but has a positive  family history of colon polyps.  She has never had a colonoscopy.  The  patient was hospitalized at the [facility] in February, earlier this  year, with chest discomfort and a cardiac catheterization was done by Dr.  Riley Kill.  She had placement of Medtronic pacemaker on July 11, 2005 by  Dr. Graciela Husbands.   HISTORY:  1. Previous cardiac evaluation in 2000.  2. Coronary artery disease with stent placement, ejection fraction of 55%      to 65%.  3. She also has a history of hyperlipidemia.  4. Glaucoma.   OPERATIONS:  1. Appendectomy.  2. Hysterectomy.   FAMILY HISTORY:  Positive for heart disease and family history of colon  polyps.   SOCIAL HISTORY:  She is widowed, has 4 children, 10th-grade education.  She  does smoke, does not drink.   REVIEW OF SYSTEMS:  Weight is stable.  She has hearing problems and leakage  of urine.   MEDICATIONS:  1. Xanax 0.5 mg nightly.  2. Aspirin 81 mg p.o. daily.  3. GlycoLax 17 g daily.   PHYSICAL EXAM:  VITAL SIGNS:  Blood pressure 140/70, pulse 80 and weight 125  pounds.  GENERAL:  The patient was hard of hearing and it was difficult to  communicate with her.  EYES:  Sclerae were  anicteric.  NECK:  Supple with no adenopathy.  CHEST:  Lungs were clear to auscultation.  Implanted pacemaker in left  precordium.  COR:  Normal S1 and normal S2.  ABDOMEN:  Soft, scaphoid, with normoactive bowel sounds.  Liver edge at  costal margin.  Lower abdomen was normal with right lower quadrant  discomfort on deep pressure.  I could not appreciate any hernia.  No bruit.  No CVA tenderness on that side.  RECTAL:  Strictures or spastic rectal sphincter which barely admitted  finger; the patient had some discomfort with it.  She had spacious rectal  ampulla with no stool in it; I could not check for Hemoccult.  Apparently,  the patient took an enema earlier today.   IMPRESSION:  [age]-year-old white female with constipation and change  in the bowel habits, which may be related to less activity, medications, as  well as change in her eating habits.  Her hemoglobin in the hospital was  normal, but I was unable to check her stool Hemoccults today because she  took an enema this morning.  I suspect she may have significant  diverticulosis which could contribute to the constipation.  We need to rule  out the possibility of partial colon obstruction.  Clearly, GlycoLax is not  satisfying her symptoms.  She seems to have incontinence as a result of it,  so we need to switch her to some other medications; I would suggest senna,  which is a mild stimulant.   PLAN:  1. Colonoscopy scheduled to assess for structural abnormality of the      colon.  2. Begin Senokot two to three at bedtime.  The patient will obtain this      medicine over the counter, although I did write her a prescription.      Depending on the results, we may have to add some additional laxative      such as Dulcolax once a week.  I gave her a booklet on constipation and      high-fiber diet.                                   Anna [name]. Juanda Chance, MD   DMB/MedQ  DD:  01/22/2006  DT:  01/23/2006  Job #:   [id number]   cc:   Aida Puffer

## 2010-10-11 NOTE — Op Note (Signed)
NAMEJAQUELYN, SAKAMOTO               ACCOUNT NO.:  [id number]   MEDICAL RECORD NO.:  [id number]          PATIENT TYPE:  INP   LOCATION:  2002                         FACILITY:  [facility]   PHYSICIAN:  Duke Salvia, M.D.  DATE OF BIRTH:  29-Oct-1919   DATE OF PROCEDURE:  07/11/2005  DATE OF DISCHARGE:                                 OPERATIVE REPORT   PREOPERATIVE DIAGNOSIS:  Sinus node dysfunction with presyncope.   POSTOPERATIVE DIAGNOSIS:  Sinus node dysfunction with presyncope.   OPERATION PERFORMED:  Dual chamber pacemaker implantation.   DESCRIPTION OF PROCEDURE:  Following the obtaining of informed consent, the  patient was brought to the electrophysiology laboratory and placed on the  fluoroscopic table in supine position (cath lab 3).  After routine prep and  drape of the left upper chest, lidocaine was infiltrated in the prepectoral  and subclavicular region and incision was made and carried down to the layer  of the prepectoral fascia using electrocautery and sharp dissection.  A  pocket was formed similarly.  Hemostasis was obtained.   Thereafter attention was turned to gaining access to the extrathoracic left  subclavian vein which was accomplished with modest difficulty but without  the aspiration of air or puncture to the artery. Two separate venipunctures  were accomplished. Guidewires were placed and maintained and a 0 silk suture  was placed in a figure-of-eight fashion and allowed to hang loosely.   Sequentially, 7 French tear-away introducer sheaths were placed through  which were passed a Medtronic 5076 52 cm active fixation ventricular lead,  serial [id number] and a St. Jude [id number]  46 cm passive fixation atrial lead  serial number [id number].  Under fluoroscopic guidance they were manipulated to  the right ventricular apex and the right atrial appendage respectively where  the bipolar R-wave was 14.6 mV with a pacing impedance of 1156 ohms and  threshold of 1.5 V  at 0.5 msec.  Current at threshold was 1.3 mA and there  was no diaphragmatic pacing at 10 V.   The bipolar P-wave was 2.2 mV with a pacing impedance of 441 ohms and  threshold of 0.4 V ant 0.5 msec and current at threshold was 0.9 mA.  With  these acceptable parameters recorded, the leads were secured to the  prepectoral fascia and then attached to a Medtronic Adapta ADDR01 pulse  generator, serial number [id number].  Ventricular pacing and AV pacing were  identified.  The pocket was copiously irrigated with antibiotic containing  saline solution.  Hemostasis was assured and the leads and the pulse  generator were then placed in the pocket and secured to the prepectoral  fascia. The wound was closed in three layers in a normal fashion.  The wound  was washed, dried and then a benzoin and Steri-Strip dressing was thereafter  applied.  Sponge, needle and instrument counts were correct at the end of  the procedure according to the staff.  The patient tolerated the procedure  without apparent complication.          ______________________________  Duke Salvia, M.D.    SCK/MEDQ  D:  07/11/2005  T:  07/12/2005  Job:  [id number]

## 2010-10-11 NOTE — Assessment & Plan Note (Signed)
[facility]                            CARDIOLOGY OFFICE NOTE   RUWAYDA, CURET                      MRN:          [id number]  DATE:05/07/2006                            DOB:          12-Jul-1919    Miss Wojnar returns today for further management of her coronary artery  disease. She was admitted July 09, 2005 for chest pain. She ruled  out for myocardial infarction. She underwent catheterization. It showed  a 30%  instant restenosis of her proximal LAD, EF of 65%. __________  from 3 to 5, second pulses and near syncope at home. A Medtronic pacer  was put in my Dr. Graciela Husbands on February 16.  She is very confused about her medicines and in fact is taking none of  the medicines we recommended including aspirin. She says she could not  take the Pravachol because it made her dizzy.  She is on Xanax 0.5 mg p.o. q.h.s., Glycolax, and Tylenol.  Her blood pressure today is 140/76, her pulse is 77 and regular, weight  is 125. She is elderly. She is in no acute distress.  Skin is warm and dry.  HEENT: Unremarkable, Carotid upstrokes were equal bilaterally without  bruits. There is no thyromegaly. Trachea is midline.  LUNGS: Clear.  HEART: Reveals a S4, normal S1, S2.  ABDOMEN EXAM : Soft, good bowel sounds.  EXTREMITIES: Reveal no edema. Pulses are present.   ASSESSMENT/PLAN:  Miss Younce is doing well despite [place] and herself! She  is not even taking aspirin. She can not take Pravachol because of  dizziness. At this point I do not think it is worth pursuing.  I am delighted that she had no recent stenosis or had any other event at  the time of her hospitalization in February. I have made no changes in  her program.   EKG was checked prior to discharge from the office today. She has a  electronic atrial pacer rate of 77 beats per minute.  I made no changes today.     Thomas C. Daleen Squibb, MD, [facility]  Electronically Signed    TCW/MedQ  DD: 05/08/2006  DT:  05/08/2006  Job #: [id number]   cc:   Aida Puffer

## 2010-10-11 NOTE — H&P (Signed)
Anna [name], Anna [name]               ACCOUNT NO.:  [id number]   MEDICAL RECORD NO.:  [id number]          PATIENT TYPE:  INP   LOCATION:  1831                         FACILITY:  [facility]   PHYSICIAN:  Arvilla Meres, M.D. LHCDATE OF BIRTH:  1919/11/27   DATE OF ADMISSION:  07/09/2005  DATE OF DISCHARGE:                                HISTORY & PHYSICAL   PRIMARY CARDIOLOGIST:  Maisie Fus C. Wall, M.D.   REASON FOR ADMISSION:  Ms. Toole is an 77 year old female, with history of  single-vessel coronary artery disease status post bare metal stenting of he  proximal left anterior descending artery in January 2000, who has done  extremely well since then with no subsequent cardiac catheterizations or  stress tests.  She now presents to the emergency room with a several-day  history of recurrent exertional upper chest discomfort.   The patient presents with her daughter, who states that the patient is  extremely viable for her age and continues to remain active around the  house.  She has done well all these years since undergoing her lone cardiac  catheterization in 2000.  She has not had any chest pain until this past  Sunday when she developed chest tightness that prevented her from going to  the church.  She has not taken any nitroglycerin, and after her chest pain  recurred early this morning, she spoke to her daughter who contacted our  office.  She was initially instructed to proceed to her primary care  physician, was seen at [facility] here in [place].  An EKG was done.  The  patient was not treated with any nitroglycerin.  She did take aspirin early  this morning.  She reports that her pain was essentially relieved at that  point, and she was then transported via EMS to the emergency room.   Since arrival, the patient has not had any recurrent chest discomfort.  Electrocardiogram shows normal sinus rhythm with Q waves in the anterior  septal leads which appear new since previous  EKGs.   The patient states that this chest discomfort is similar to what she had  prior to undergoing stenting in 2002.   ALLERGIES:  VALIUM.   HOME MEDICATIONS:  1.  Pravachol 5 mg nightly.  2.  Aspirin 81 mg daily.  3.  Xanax 0.5 mg nightly p.r.n.   PAST MEDICAL HISTORY:  1.  Single-vessel coronary artery disease with bare metal stenting 80%      proximal LAD January 2000.  2.  Normal left ventricular function  3.  Mild mitral regurgitation with 2-D echocardiogram December 2006.  4.  Hyperlipidemia.  5.  History of hiatal hernia.  6.  Status post appendectomy.  7.  Status post hysterectomy.  8.  History of glaucoma.   SOCIAL HISTORY:  The patient lives in [place] with her son.  She has never  smoked tobacco and denies alcohol use.   FAMILY HISTORY:  Mother deceased secondary to myocardial infarction.  Father  died of a stroke.  The patient has 9 siblings, some of whom did die from  heart attacks, according  to her daughter.   REVIEW OF SYSTEMS:  Reported exertional dyspnea for the past 3 to 4 weeks,  but no orthopnea, PND, or lower extremity edema.  She has no current active  reflux symptoms but does have occasional constipation. Denies any recent  evidence of upper or lower GI bleeding.  Otherwise as noted per HPI,  remaining systems negative.   PHYSICAL EXAMINATION:  VITAL SIGNS:  Blood pressure 143/66, pulse 60 and  regular, respirations 18, temperature 98.1.  GENERAL:  An 77 year old female in no apparent distress.  HEENT:  Normocephalic and atraumatic.  NECK:  Preserved bilateral carotid pulse without bruits.  LUNGS: Clear to auscultation all fields.  HEART:  Regular rate and rhythm (S1, S2).  No significant murmurs.  ABDOMEN: Soft, nontender, with intact bowel sounds.  EXTREMITIES:  Palpable bilateral femoral pulses without bruits; palpable  distal pulses with no significant edema.  NEUROLOGIC:  No focal deficits.   Chest x-ray: Pending.   Electrocardiogram:  Normal sinus rhythm at 61 beats per minute with normal  axis; Q waves in leads V1-V3 with no acute changes.   Laboratory data: Pending.   IMPRESSION:  Ms. Erck is an 77 year old female, with history of single-  vessel coronary artery disease status post bare metal stenting of the  proximal left anterior descending artery in January 2000, with normal left  ventricular function, and hyperlipidemia, who now presents to the emergency  room with recurrent exertional angina pectoris.  She is currently  asymptomatic.  An electrocardiogram shows no acute changes.  The patient  describes these symptoms as reminiscent of those associated with her prior  catheterization.   PLAN:  1.  The patient will be admitted to telemetry unit for continued close      monitoring and treatment of unstable angina pectoris.  2.  She will be placed on IV nitroglycerin and heparin while in the      emergency room, continued on aspirin, and started on low-dose Lopressor.  3.  She will be continued on Pravachol.  In addition, serial cardiac markers      will be cycled, and we will also check a fasting lipid profile in the      morning.   RECOMMENDATIONS:  Proceed with cardiac catheterization tomorrow.  Patient is  agreeable and will to proceed.      Gene Serpe, P.A. [facility]      Arvilla Meres, M.D. [facility]  Electronically Signed    GS/MEDQ  D:  07/09/2005  T:  07/09/2005  Job:  [id number]   cc:   Aida Puffer, M.D.  [facility] [place], [place]

## 2010-10-11 NOTE — Discharge Summary (Signed)
[name], Anna               ACCOUNT NO.:  [id number]   MEDICAL RECORD NO.:  [id number]          PATIENT TYPE:  INP   LOCATION:  2002                         FACILITY:  [facility]   PHYSICIAN:  Duke Salvia, M.D.  DATE OF BIRTH:  [birth year]   DATE OF ADMISSION:  07/09/2005  DATE OF DISCHARGE:  07/12/2005                           DISCHARGE SUMMARY - REFERRING   DISCHARGE DIAGNOSIS:  Chest discomfort of uncertain etiology, near syncope  with sinus node dysfunction status post pacer insertion.   PROCEDURES PERFORMED:  1.  Cardiac catheterization July 10, 2005, by Dr. Riley Kill.  2.  Status post Medtronic pacer on July 11, 2005, by Dr. Graciela Husbands.   SUMMARY OF HISTORY:  Ms. Neglia is an 77 year old female who presented to  the emergency room stating that she thought she was going to have a heart  attack.  She presented with upper sternal chest discomfort since the  preceding Sunday.  She felt that this was similar to prior to her PCI.  She  did not take any nitroglycerin and her discomfort has been constant since  Sunday.  She called our office today and was told to see her primary care  physician.  She called EMS and presented to the emergency room.   Her past medical history is notable for a stent in the year 2000, recent  memory difficulties, appendectomy, hysterectomy, hiatal hernia.  Her last EF  was 55-65% in December 2006.  She also has a history of  hyperlipidemia and  glaucoma.   LABORATORY DATA:  Chest x-ray on February 14 showed mild cardiomegaly,  probable mild chronic interstitial lung disease.  Chest x-ray on the  February 17 showed no pneumothorax post pacer insertion.  Admission H&H was  15 and 44.5, normal indices, platelets 254, WBC 5.5.  Subsequent  hematologies remained unchanged.  Admission PTT was 35, PT 13.5.  Sodium  141, potassium 4.1, BUN 15, creatinine 1.  Normal LFTs.  Prior to discharge,  sodium 140, potassium 3.6, BUN 12, creatinine 1.  CK, MBs,  and troponins  were negative x 2.  Fasting lipids showed a total cholesterol 152,  triglycerides 46, HDL 57, LDL 86.  TSH was 3.131.  EKGs on admission showed  sinus bradycardia with a ventricular rate of 53, baseline artifact.  Subsequent EKGs showed atrial pacing.   HOSPITAL COURSE:  Ms. Buresh was admitted to [facility].  Overnight, she was placed on IV heparin and her home medications as well as  a low dose beta blocker and Nexium.  Overnight, she did not have any further  chest discomfort but did complain of shortness of breath.  Nursing notes a  3.1 second pause.  Dr. Tenny Craw, after reviewing, felt that she had significant  bradycardia with pauses between 3 to 5 seconds with a history of dizziness  and near syncope at home.  Cardiac catheterization was performed on July 10, 2005, by Dr. Samule Ohm.  This showed a 30% in stent restenosis of the  proximal LAD, EF 65% without wall motion abnormalities.  Loura Pardon and Dr.  Ladona Ridgel performed EP consultation in  regards to her pauses.  Orthostatics  were performed and did not show any significant changes.  It was felt that  after review by Dr. Graciela Husbands that she would benefit from pacer insertion.  This  was performed on July 11, 2005, by Dr. Graciela Husbands.  February 17, Medtronic  interrogated device and post chest x-ray was intact.  February 17, after  review, Dr. Graciela Husbands felt that she could be discharged home.   DISPOSITION:  The patient is discharged home with supplemental discharge  instructions in regards to her pacemaker device.   DISCHARGE MEDICATIONS:  She is asked to continue her Pravachol 5 mg q.h.s.,  aspirin 81 mg daily, and Xanax 0.5 mg q.h.s. as needed at bedtime.  Maintain  a low salt, fat, and cholesterol diet.   FOLLOW UP:  She was asked to call our office on Monday to arrange a ten day  appointment with the pacer clinic and a three month appointment with Dr.  Graciela Husbands.  She will follow up with Dr. Clarene Duke and Dr. Daleen Squibb as  needed.      Joellyn Rued, P.A. [facility]    ______________________________  Duke Salvia, M.D.    EW/MEDQ  D:  07/12/2005  T:  07/12/2005  Job:  [id number]   cc:   Aida Puffer, M.D.   Thomas C. Wall, M.D.  [address]  [address]  [place]  [place] [address]

## 2010-10-11 NOTE — Assessment & Plan Note (Signed)
[facility]                           GASTROENTEROLOGY OFFICE NOTE   NAME:Anna [name], Anna [name]                      MRN:          [id number]  DATE:04/08/2006                            DOB:          [birth year]    Ms. Anna [name] is a very nice 77 year old white female with chronic constipation  and rectal stenosis. Her colonoscopy on February 03, 2006 showed  adenomatous polyp of the colon. She had a moderately tortuous colon but no  definite diverticulosis. She also had internal hemorrhoids. We have put her  on Senokot, 2 at bedtime and Dulcolax once a week. She really likes her  Dulcolax but feels she needs to take it more often. She could not tolerate  Senokot because it gives her a lot of side effects among other dizziness. I  am not sure about the association of the Senokot and her dizziness but she  has not been able to take it. She was in the past on Glycolax but was having  accidents.   PHYSICAL EXAMINATION:  VITAL SIGNS:  Blood pressure 110/68, pulse 88 and  weight 123 pounds.  GENERAL:  She was alert and oriented in no distress.  ABDOMEN:  Showed flat relaxed abdomen with normal active bowel sounds.  RECTAL:  Somewhat tight rectal sphincter but I was able to do a digital exam  without any problems. There was a small amount of Hemoccult negative stool  in the ampulla.   IMPRESSION:  An 77 year old white female with mild rectal stenosis and  chronic constipation. She is responding to Dulcolax tablets after trying  numerous over-the-counter laxatives.   PLAN:  Increase Dulcolax to one every day or every other day according to  her bowel habits. This seems to be the only laxative so far that seems to  satisfy her. I will also consider using Amitiza which would be our second  choice in case the Dulcolax does not work. We will see her again in about 4  months.     Hedwig Morton. Juanda Chance, MD  Electronically Signed    DMB/MedQ  DD: 04/08/2006  DT:  04/08/2006  Job #: [id number]   cc:   Aida Puffer

## 2010-10-11 NOTE — Assessment & Plan Note (Signed)
[facility]                         GASTROENTEROLOGY OFFICE NOTE   NAME:Lienhard, Senie L                      MRN:          [id number]  DATE:07/29/2006                            DOB:          03-Sep-1919    Ms. Filosa is an 77 year old white female with chronic constipation, who  developed rectal pain several weeks ago.  She was given some topical  medication for her rectum by Dr. Clarene Duke with marked improvement of her  symptoms.  We have had her on MiraLax, as well as on alternating  Dulcolax and Senokot.  She has most recently tried Mylanta double-  strength about two swallows on a daily basis, which seemed to relieve  the constipation quite well.   PHYSICAL EXAM:  Today, blood pressure 116/66, pulse 60 and weight 123  pounds.  ABDOMINAL EXAM:  Unremarkable with normoactive bowel sounds.  RECTAL:  Anoscopic exam shows normal perianal area.  Rectal tone was  somewhat tense, but she did not have any stricture.  There were 1+  internal hemorrhoids.  One of the hemorrhoids was erythematous and  enlarged, but did not prolapse.  Stool was Hemoccult negative.   IMPRESSION:  1. Symptomatic internal hemorrhoids, resolving.  2. Chronic constipation.   PLAN:  1. Anusol HC suppositories q.h.s.  2. Samples and the prescription for Analpram cream1% given.  3. Continue double-strength Mylanta II for constipation.     Hedwig Morton. Juanda Chance, MD  Electronically Signed    DMB/MedQ  DD: 07/29/2006  DT: 07/29/2006  Job #: [id number]   cc:   Aida Puffer

## 2010-10-11 NOTE — Assessment & Plan Note (Signed)
[facility]                         ELECTROPHYSIOLOGY OFFICE NOTE   NAME:Anna [name], Anna [name]                      MRN:          [id number]  DATE:07/07/2006                            DOB:          1919/06/20    Anna [name] comes in following pacemaker implantation a year ago for  sinus node dysfunction.  She is doing okay from an energy point of view.  Her big complaints continue to be her bowels.  She has been seeing Dr.  Clarene Duke and Dr. Juanda Chance for this.   MEDICATIONS TODAY:  Notable for no cardiac ones.   PHYSICAL EXAMINATION:  VITAL SIGNS:  Her blood pressure is 141/78, pulse  is 80.  LUNGS:  Clear.  HEART:  Heart sounds were regular.  EXTREMITIES:  Without edema.  SKIN:  The device pocket was well healed.   IMPRESSION:  1. Sinus node dysfunction.  2. Previously implanted pacemaker.  3. Gastrointestinal problems including diarrhea and constipation.   We will set her up an appointment to see Dr. Lina Sar back and will  see her again in 1 years' time and follow her device remotely in the  interim.     Duke Salvia, MD, [facility]  Electronically Signed    SCK/MedQ  DD: 07/07/2006  DT: 07/07/2006  Job #: [id number]   cc:   Hedwig Morton. Juanda Chance, MD  Aida Puffer, M.D.

## 2010-10-11 NOTE — Cardiovascular Report (Signed)
NAMECONCEPCION, GILLOTT               ACCOUNT NO.:  [id number]   MEDICAL RECORD NO.:  [id number]          PATIENT TYPE:  INP   LOCATION:  2002                         FACILITY:  [facility]   PHYSICIAN:  Salvadore Farber, M.D. LHCDATE OF BIRTH:  Oct 07, 1919   DATE OF PROCEDURE:  07/10/2005  DATE OF DISCHARGE:                              CARDIAC CATHETERIZATION   PROCEDURE:  Left heart catheterization, left ventriculography, coronary  angiography.   INDICATIONS:  Ms. Salvador is an 77 year old woman status post bare metal  stenting of her LAD in 2000.  She had done well since then with respect to  chest pain.  However, she has had multiple episodes of near syncope.  On  Sunday she had an episode of fairly prolonged chest discomfort which  prevented her from going to church.  She had a recurrent episode on Monday.  This prompted hospitalization.  She was ruled out for myocardial infarction  by serial enzymes and electrocardiograms.  She was then referred for  diagnostic angiography.  I should note that on monitor while in hospital she  has had pauses of up to 5 seconds.   PROCEDURAL TECHNIQUE:  Informed consent was obtained.  Under 1% lidocaine  local anesthesia a 5-French sheath was placed in the right common femoral  artery using the modified Seldinger technique.  Diagnostic angiography and  ventriculography were performed using JL4, JR4, and pigtail catheters.  The  patient tolerated the procedure well and was transferred to the holding room  in stable condition.  Sheaths will be removed there.   COMPLICATIONS:  None.   FINDINGS:  1.  LV:  160/8/14.  EF 65% without regional wall motion abnormalities.  2.  Other stenosis or mitral regurgitation.  3.  Left main:  Angiographically normal.  4.  LAD:  Moderate sized vessel giving rise to a single diagonal which      arises very proximally.  There is a previously placed stent in the LAD      beginning just after the diagonal.  This has  approximately 20-30% in-      stent restenosis.  5.  Circumflex:  Moderate-sized vessel giving rise to two obtuse marginals.      It has only minor luminal irregularities.  6.  RCA:  Moderate-sized dominant vessel.  It is angiographically normal.   IMPRESSION/PLAN:  Patient has minimal non-obstructive coronary disease with  no significant in-stent restenosis of the mid LAD stent.  I thus think her  chest pain is not cardiac in etiology.  However, I am concerned with her  episodes of presyncope and substantial pauses.  Will ask EP to see her.      Salvadore Farber, M.D. [facility]  Electronically Signed     WED/MEDQ  D:  07/10/2005  T:  07/10/2005  Job:  [id number]   cc:   Aida Puffer  Fax: [phone]   Jesse Sans. Wall, M.D.  [address]  [address]  [place]  [place] [address]

## 2010-11-21 ENCOUNTER — Encounter: Payer: Medicare Other | Admitting: *Deleted

## 2010-11-26 ENCOUNTER — Encounter: Payer: Self-pay | Admitting: *Deleted

## 2011-01-20 ENCOUNTER — Ambulatory Visit: Payer: Medicare Other | Admitting: Internal Medicine

## 2011-01-28 ENCOUNTER — Ambulatory Visit: Payer: Medicare Other | Admitting: Internal Medicine

## 2011-02-07 ENCOUNTER — Ambulatory Visit: Payer: Medicare Other | Admitting: Internal Medicine

## 2011-02-28 ENCOUNTER — Ambulatory Visit (INDEPENDENT_AMBULATORY_CARE_PROVIDER_SITE_OTHER): Payer: Medicare Other | Admitting: Internal Medicine

## 2011-02-28 ENCOUNTER — Encounter: Payer: Self-pay | Admitting: Internal Medicine

## 2011-02-28 VITALS — BP 110/60 | HR 88 | Temp 98.6°F | Ht 65.0 in | Wt 121.8 lb

## 2011-02-28 DIAGNOSIS — F0280 Dementia in other diseases classified elsewhere without behavioral disturbance: Secondary | ICD-10-CM

## 2011-02-28 DIAGNOSIS — Z23 Encounter for immunization: Secondary | ICD-10-CM

## 2011-02-28 DIAGNOSIS — I2510 Atherosclerotic heart disease of native coronary artery without angina pectoris: Secondary | ICD-10-CM

## 2011-02-28 DIAGNOSIS — G309 Alzheimer's disease, unspecified: Secondary | ICD-10-CM

## 2011-02-28 DIAGNOSIS — F411 Generalized anxiety disorder: Secondary | ICD-10-CM

## 2011-02-28 NOTE — Assessment & Plan Note (Signed)
Not needing BZs prn - reassurance and redirection provided

## 2011-02-28 NOTE — Assessment & Plan Note (Signed)
Increased aricept 08/2010-  Family supportive and involved, attentive to support and comfort rather than aggressive eval

## 2011-02-28 NOTE — Assessment & Plan Note (Signed)
BMS to prox LAD 05/2009 -  On ASA, declines statin no CP or angina symptoms  The current medical regimen is effective;  continue present plan and medications.

## 2011-02-28 NOTE — Patient Instructions (Addendum)
It was good to see you today. We have reviewed your prior records including labs and tests today Medications reviewed, no changes at this time. Please schedule followup in 6 months, call sooner if problems. Flu shot today

## 2011-02-28 NOTE — Progress Notes (Signed)
Subjective:    Patient ID: Anna [name], female    DOB: 1919-11-14, 77 y.o.   MRN: [id number]  HPI  Here for follow up - reviewed chronic medical issues today:  memory loss - mod-adv dementia, progressive symptoms despite aricept, inc dose 09/2010 -improved with dc of klonopin and dtr feels pt is doing "fine" - declines neuro eval or further testing or other med tx  Hx syncope - hosp for same 04/2010 - cardio and neuro eval and imaging unremarkable -- med changes reviewed (decrease in centrally acting BZ doses) - no recurrence - no HA or CP  CAD hx - BMS to prox LAD 05/2009 - reports compliance with ongoing medical treatment and no changes in medication dose or frequency. denies adverse side effects related to current therapy. no CP or angina symptoms   dyslipidemia - not on rx med for same - follows low fat diet (supplied by family)  IBS hx, constip predom - constant concern to pt - reports no OP med tx helps - no abd pain or rectal bleeding - no weight loss or diarrhea  RUL nodule on CT chest 04/2010 - family declines further evaluation  Past Medical History  Diagnosis Date  . COLONIC POLYPS, ADENOMATOUS   . MITRAL REGURGITATION   . CORONARY, ARTERIOSCLEROSIS   . Irritable bowel syndrome   . ALZHEIMERS DISEASE   . ANXIETY   . HYPERCHOLESTEROLEMIA   . HERNIA, HIATAL, NONCONGENITAL   . Abnormal chest CT 04/2010    RUL nodule    Review of Systems  Constitutional: Negative for fever.  Respiratory: Negative for cough and shortness of breath.   Cardiovascular: Negative for chest pain and palpitations.  Neurological: Negative for syncope and headaches.       Objective:   Physical Exam BP 110/60  Pulse 88  Temp(Src) 98.6 F (37 C) (Oral)  Ht 5\' 5"  (1.651 m)  Wt 121 lb 12.8 oz (55.248 kg)  BMI 20.27 kg/m2  SpO2 98% Wt Readings from Last 3 Encounters:  02/28/11 121 lb 12.8 oz (55.248 kg)  09/20/10 115 lb 1.9 oz (52.218 kg)  06/17/10 121 lb 1.9 oz (54.939 kg)    Constitutional: She is well-developed and well-nourished. No distress. Dtr and g-granddtr at side Neck: Normal range of motion. Neck supple. No JVD present. No thyromegaly present.  Cardiovascular: Normal rate, regular rhythm and normal heart sounds.  No murmur heard. No BLE edema. Pulmonary/Chest: Effort normal and breath sounds normal. No respiratory distress. She has no wheezes. Neurological: She is alert and oriented to self but not place, purpose or time. No cranial nerve deficit. Coordination and balance normal.  Psychiatric: She has a normal mood and affect. Her behavior is normal. Frequent redirection/reminding   Lab Results  Component Value Date   WBC 5.9 05/14/2010   HGB 14.0 05/14/2010   HCT 43.2 05/14/2010   PLT 197 05/14/2010   CHOL  Value: 189        ATP III CLASSIFICATION:  <200     mg/dL   Desirable  409-811  mg/dL   Borderline High  >=914    mg/dL   High        78/29/5621   TRIG 53 05/12/2010   HDL 65 05/12/2010   ALT 14 05/11/2010   AST 34 05/11/2010   NA 143 05/14/2010   K 3.8 05/14/2010   CL 109 05/14/2010   CREATININE 0.78 05/14/2010   BUN 11 05/14/2010   CO2 26 05/14/2010   TSH 2.009  05/12/2010   INR 1.00 05/11/2010   HGBA1C  Value: 5.3 (NOTE)                                                                       According to the ADA Clinical Practice Recommendations for 2011, when HbA1c is used as a screening test:   >=6.5%   Diagnostic of Diabetes Mellitus           (if abnormal result  is confirmed)  5.7-6.4%   Increased risk of developing Diabetes Mellitus  References:Diagnosis and Classification of Diabetes Mellitus,Diabetes Care,2011,34(Suppl 1):S62-S69 and Standards of Medical Care in         Diabetes - 2011,Diabetes Care,2011,34  (Suppl 1):S11-S61. 05/11/2010       Assessment & Plan:  See problem list. Medications and labs reviewed today.

## 2011-03-03 DIAGNOSIS — Z23 Encounter for immunization: Secondary | ICD-10-CM

## 2011-04-07 ENCOUNTER — Encounter: Payer: Self-pay | Admitting: Endocrinology

## 2011-04-07 ENCOUNTER — Ambulatory Visit (INDEPENDENT_AMBULATORY_CARE_PROVIDER_SITE_OTHER): Payer: Medicare Other | Admitting: Endocrinology

## 2011-04-07 VITALS — BP 122/60 | HR 86 | Temp 98.0°F | Wt 124.2 lb

## 2011-04-07 DIAGNOSIS — L02619 Cutaneous abscess of unspecified foot: Secondary | ICD-10-CM

## 2011-04-07 DIAGNOSIS — L03119 Cellulitis of unspecified part of limb: Secondary | ICD-10-CM

## 2011-04-07 MED ORDER — CEPHALEXIN 500 MG PO CAPS: 500.0000 mg | ORAL_CAPSULE | Freq: Four times a day (QID) | ORAL | Status: AC

## 2011-04-07 NOTE — Progress Notes (Signed)
  Subjective:    Patient ID: Anna [name], female    DOB: 05-Jul-1919, 77 y.o.   MRN: [id number]  HPI Pt struck her left foot on a piece of furniture 4 days ago.  Since then, she has slight pain there, but no assoc itching. Past Medical History  Diagnosis Date  . COLONIC POLYPS, ADENOMATOUS   . MITRAL REGURGITATION   . CORONARY, ARTERIOSCLEROSIS   . Irritable bowel syndrome   . ALZHEIMERS DISEASE   . ANXIETY   . HYPERCHOLESTEROLEMIA   . HERNIA, HIATAL, NONCONGENITAL   . Abnormal chest CT 04/2010    RUL nodule    Past Surgical History  Procedure Date  . Appendectomy   . Abdominal hysterectomy 1971  . S/p ptca and stent-80% lad  05/2009  . Pacemaker implanation 07/11/05    History   Social History  . Marital Status: Widowed    Spouse Name: N/A    Number of Children: N/A  . Years of Education: N/A   Occupational History  . Not on file.   Social History Main Topics  . Smoking status: Never Smoker   . Smokeless tobacco: Not on file  . Alcohol Use:   . Drug Use:   . Sexually Active:    Other Topics Concern  . Not on file   Social History Narrative   Retired from [employer]. Divorced: Husband was alcoholic and abandoned her 1950's. 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 aspirin 81 MG tablet Take 81 mg by mouth daily.        Marland Kitchen donepezil (ARICEPT) 10 MG tablet Take 1 tablet (10 mg total) by mouth daily.  30 tablet  6  . Multiple Vitamins-Minerals (ONE DAILY FOR WOMEN 50+ ADV PO) Take by mouth daily.          Allergies  Allergen Reactions  . Diazepam     Family History  Problem Relation Age of Onset  . Stroke Father    BP 122/60  Pulse 86  Temp(Src) 98 F (36.7 C) (Oral)  Wt 124 lb 3.2 oz (56.337 kg)  SpO2 97%  Review of  Systems Denies fever    Objective:   Physical Exam Vital signs: see vs page Gen: elderly, frail, no distress Left foot: at the instep area, there is a 1.5 cm area of avulsed skin, with a 2 cm rim of surrounding erythema.       Assessment & Plan:  Skin avulsion, with early cellulitis.  New. She is at high risk of worsening of cellulitis.

## 2011-04-07 NOTE — Patient Instructions (Addendum)
i have sent a prescription to your pharmacy, for an antibiotic. You will get better much faster if you elevate your foot above the rest of your body. I hope you feel better soon.  If you don't feel better by next week, please call dr Felicity Coyer.  If the redness spreads, call us right away.  Keep the area of broken skin covered, with antibiotic ointment, and a bandaid.

## 2011-04-09 DIAGNOSIS — L03119 Cellulitis of unspecified part of limb: Secondary | ICD-10-CM | POA: Insufficient documentation

## 2011-07-15 ENCOUNTER — Emergency Department (HOSPITAL_COMMUNITY): Payer: Medicare Other

## 2011-07-15 ENCOUNTER — Observation Stay (HOSPITAL_COMMUNITY)
Admission: EM | Admit: 2011-07-15 | Discharge: 2011-07-16 | Disposition: A | Discharge status: Home / Self Care | Payer: Medicare Other | Attending: Internal Medicine | Admitting: Internal Medicine

## 2011-07-15 ENCOUNTER — Telehealth: Payer: Self-pay

## 2011-07-15 ENCOUNTER — Encounter (HOSPITAL_COMMUNITY): Payer: Self-pay

## 2011-07-15 ENCOUNTER — Other Ambulatory Visit: Payer: Self-pay

## 2011-07-15 DIAGNOSIS — Z9861 Coronary angioplasty status: Secondary | ICD-10-CM | POA: Insufficient documentation

## 2011-07-15 DIAGNOSIS — R42 Dizziness and giddiness: Secondary | ICD-10-CM | POA: Insufficient documentation

## 2011-07-15 DIAGNOSIS — K5900 Constipation, unspecified: Secondary | ICD-10-CM | POA: Insufficient documentation

## 2011-07-15 DIAGNOSIS — R079 Chest pain, unspecified: Principal | ICD-10-CM

## 2011-07-15 DIAGNOSIS — R0602 Shortness of breath: Secondary | ICD-10-CM | POA: Insufficient documentation

## 2011-07-15 DIAGNOSIS — R61 Generalized hyperhidrosis: Secondary | ICD-10-CM | POA: Insufficient documentation

## 2011-07-15 DIAGNOSIS — M79609 Pain in unspecified limb: Secondary | ICD-10-CM | POA: Insufficient documentation

## 2011-07-15 DIAGNOSIS — R110 Nausea: Secondary | ICD-10-CM | POA: Insufficient documentation

## 2011-07-15 DIAGNOSIS — F0280 Dementia in other diseases classified elsewhere without behavioral disturbance: Secondary | ICD-10-CM | POA: Insufficient documentation

## 2011-07-15 DIAGNOSIS — G309 Alzheimer's disease, unspecified: Secondary | ICD-10-CM | POA: Insufficient documentation

## 2011-07-15 DIAGNOSIS — I2510 Atherosclerotic heart disease of native coronary artery without angina pectoris: Secondary | ICD-10-CM | POA: Insufficient documentation

## 2011-07-15 HISTORY — DX: Personal history of other diseases of the digestive system: Z87.19

## 2011-07-15 HISTORY — DX: Angina pectoris, unspecified: I20.9

## 2011-07-15 LAB — COMPREHENSIVE METABOLIC PANEL
Albumin: 3.6 g/dL (ref 3.5–5.2)
Alkaline Phosphatase: 60 U/L (ref 39–117)
BUN: 13 mg/dL (ref 6–23)
Chloride: 103 mEq/L (ref 96–112)
Creatinine, Ser: 0.8 mg/dL (ref 0.50–1.10)
GFR calc Af Amer: 74 mL/min — ABNORMAL LOW (ref >90)
Glucose, Bld: 96 mg/dL (ref 70–99)
Potassium: 4.4 mEq/L (ref 3.5–5.1)
Total Bilirubin: 0.4 mg/dL (ref 0.3–1.2)
Total Protein: 6.8 g/dL (ref 6.0–8.3)

## 2011-07-15 LAB — CBC
HCT: 39.1 % (ref 36.0–46.0)
Hemoglobin: 14 g/dL (ref 12.0–15.0)
MCH: 29.7 pg (ref 26.0–34.0)
MCHC: 32.2 g/dL (ref 30.0–36.0)
MCV: 91.4 fL (ref 78.0–100.0)
Platelets: 247 10*3/uL (ref 150–400)
RBC: 4.28 MIL/uL (ref 3.87–5.11)
RDW: 14.4 % (ref 11.5–15.5)
WBC: 4.5 10*3/uL (ref 4.0–10.5)

## 2011-07-15 LAB — PROTIME-INR
INR: 0.97 (ref 0.00–1.49)
Prothrombin Time: 13.1 seconds (ref 11.6–15.2)

## 2011-07-15 LAB — CARDIAC PANEL(CRET KIN+CKTOT+MB+TROPI): Troponin I: <0.3 ng/mL (ref <0.30)

## 2011-07-15 LAB — DIFFERENTIAL
Basophils Absolute: 0 10*3/uL (ref 0.0–0.1)
Basophils Relative: 0 % (ref 0–1)
Eosinophils Absolute: 0.1 10*3/uL (ref 0.0–0.7)
Monocytes Relative: 6 % (ref 3–12)
Neutro Abs: 2.9 10*3/uL (ref 1.7–7.7)
Neutrophils Relative %: 65 % (ref 43–77)

## 2011-07-15 LAB — CREATININE, SERUM: GFR calc Af Amer: 68 mL/min — ABNORMAL LOW (ref >90)

## 2011-07-15 LAB — TROPONIN I: Troponin I: <0.3 ng/mL (ref <0.30)

## 2011-07-15 MED ORDER — ONDANSETRON HCL 4 MG/2ML IJ SOLN: 4.0000 mg | Freq: Four times a day (QID) | INTRAMUSCULAR | Status: DC | PRN

## 2011-07-15 MED ORDER — SODIUM CHLORIDE 0.9 % IV SOLN: 250.0000 mL | INTRAVENOUS | Status: DC | PRN

## 2011-07-15 MED ORDER — DONEPEZIL HCL 10 MG PO TABS
10.0000 mg | ORAL_TABLET | Freq: Every day | ORAL | Status: DC
  Administered 2011-07-15: 10 mg via ORAL
  Filled 2011-07-15 (×2): qty 1

## 2011-07-15 MED ORDER — ZOLPIDEM TARTRATE 5 MG PO TABS
5.0000 mg | ORAL_TABLET | Freq: Every evening | ORAL | Status: DC | PRN
  Administered 2011-07-15: 5 mg via ORAL
  Filled 2011-07-15: qty 1

## 2011-07-15 MED ORDER — SODIUM CHLORIDE 0.9 % IJ SOLN
3.0000 mL | INTRAMUSCULAR | Status: DC | PRN
  Administered 2011-07-15 – 2011-07-16 (×2): 3 mL via INTRAVENOUS

## 2011-07-15 MED ORDER — ASPIRIN EC 81 MG PO TBEC
81.0000 mg | DELAYED_RELEASE_TABLET | Freq: Every day | ORAL | Status: DC
  Administered 2011-07-15 – 2011-07-16 (×2): 81 mg via ORAL
  Filled 2011-07-15 (×2): qty 1

## 2011-07-15 MED ORDER — ENOXAPARIN SODIUM 40 MG/0.4ML ~~LOC~~ SOLN
40.0000 mg | Freq: Every day | SUBCUTANEOUS | Status: DC
  Administered 2011-07-15: 40 mg via SUBCUTANEOUS
  Filled 2011-07-15 (×2): qty 0.4

## 2011-07-15 MED ORDER — SODIUM CHLORIDE 0.9 % IJ SOLN
3.0000 mL | Freq: Two times a day (BID) | INTRAMUSCULAR | Status: DC
  Administered 2011-07-15 – 2011-07-16 (×2): 3 mL via INTRAVENOUS

## 2011-07-15 MED ORDER — NITROGLYCERIN 0.4 MG SL SUBL: 0.4000 mg | SUBLINGUAL_TABLET | SUBLINGUAL | Status: DC | PRN

## 2011-07-15 MED ORDER — ACETAMINOPHEN 325 MG PO TABS: 650.0000 mg | ORAL_TABLET | ORAL | Status: DC | PRN

## 2011-07-15 MED ORDER — DOCUSATE SODIUM 100 MG PO CAPS
100.0000 mg | ORAL_CAPSULE | Freq: Every day | ORAL | Status: DC
  Administered 2011-07-16: 100 mg via ORAL
  Filled 2011-07-15: qty 1

## 2011-07-15 NOTE — Telephone Encounter (Signed)
Pt's daughter called stating her brother contacted her this morning because pt called complaining if numbness of the left arm, palpitations and a feeling that she "was about to die". Daughter says the pt was having severe HA as well as dizziness and nausea. Daughter requested an appt with VAL today but was advised to go to [facility] ER ASAP. Daughter was resistant, stating she did not want to wait for hours in the ER but was advised that based on her sxs, she would be quickly triaged. Daughter agreed.

## 2011-07-15 NOTE — ED Notes (Signed)
medtronic called back duel chamber pacing well, lead wire good.  Atrial high rate aug 11th.  73 BPM and functioning as programmed.   Faxing a history of past changes.

## 2011-07-15 NOTE — ED Notes (Signed)
Cardiology at bedside.

## 2011-07-15 NOTE — ED Notes (Signed)
Attempted to call report to floor RN - nurse unable to take report at this time as she is still getting report on floor pts; requests I call her back in 15 minutes

## 2011-07-15 NOTE — ED Notes (Addendum)
Anna [name], daughter, [phone] (contact #)

## 2011-07-15 NOTE — ED Notes (Signed)
[id number] Ready 

## 2011-07-15 NOTE — ED Notes (Signed)
Up to restroom with assistance of NA - pt reports dizziness with ambulation

## 2011-07-15 NOTE — Consult Note (Addendum)
History and Physical   Patient ID: Anna [name] MRN: [id number], DOB/AGE: [birth year]   Admit date: 07/15/2011 Date of Consult: 07/15/2011   Primary Physician: Rene Paci, MD, MD Primary Cardiologist: Dr. Berton Mount  Pt. Profile: Anna [name] is a 78yo female with PMHx significant for CAD (s/p PCI BMS to mid LAD 05/1998, repeat cath in 02/07 revealed mild in-stent restenosis, otherwise patent), sinus node dysfunction (s/p Medtronic PM implantation 02/07), hx of syncope and hiatal hernia who presents to [facility] ED today with chest pain.    Problem List: Past Medical History  Diagnosis Date  . COLONIC POLYPS, ADENOMATOUS   . MITRAL REGURGITATION   . CORONARY, ARTERIOSCLEROSIS   . Irritable bowel syndrome   . ALZHEIMERS DISEASE   . ANXIETY   . HYPERCHOLESTEROLEMIA   . HERNIA, HIATAL, NONCONGENITAL   . Abnormal chest CT 04/2010    RUL nodule    Past Surgical History  Procedure Date  . Appendectomy   . Abdominal hysterectomy 1971  . S/p ptca and bms-80% lad 05/2009  . Pacemaker implanation 07/11/05    Medtronic     Allergies:  Allergies  Allergen Reactions  . Diazepam Hives    HPI:   That patient has difficulty describing the episode of chest pain this morning. She reports experienced chest pain lasting for several minutes this morning around 4am with associated shortness of breath, diaphoresis, lightheadedness and nausea. She denies orthopnea, PND, LE swelling. She called her son this morning complaining of these symptoms. She sat on the side of the bed, drank a cup of coffee and the pain persisted. She did not take any meds to try and alleviate the pain. She reports feeling like she was going to die. She denies recent episodes of chest pain, shortness of breath, DOE, lightheadedness and palpitations. She states she has experienced this pain prior to having her PM implanted. She is quite active and has not experienced a decrease in her activity level. She denies improvement  with position changes. No sick contacts, fevers, chills and she denies prolonged periods of immobility.   She was transported to [facility] ED by her daughter. She has not received any meds while in the ED. The pain has resolved. CXR without acute cardiopulmonary disease, chronic interstitial markings noted with L basilar scarring vs atelectasis. EKG reveals a-paced rhythm, rate 68 no acute ischemic changes. POC TnI negative. Mild hypertension noted with SBP 140-150 mmHg, otherwise VSS.   PM interrogation reveals no acute event.   Home Medications: Prior to Admission medications   Medication Sig Start Date End Date Taking? Authorizing Provider  aspirin 81 MG tablet Take 81 mg by mouth daily.     Yes Historical Provider, MD  docusate sodium (COLACE) 100 MG capsule Take 100 mg by mouth daily.   Yes Historical Provider, MD  donepezil (ARICEPT) 10 MG tablet Take 1 tablet (10 mg total) by mouth daily. 09/20/10  Yes Rene Paci, MD  Multiple Vitamins-Minerals (ONE DAILY FOR WOMEN 50+ ADV PO) Take by mouth daily.     Yes Historical Provider, MD    Inpatient Medications:      (Not in a hospital admission)  Family History  Problem Relation Age of Onset  . Stroke Father   . Heart disease Mother   . Heart disease Father   . Heart disease Brother   . Heart disease Sister      History   Social History  . Marital Status: Widowed    Spouse Name: N/A  Number of Children: N/A  . Years of Education: N/A   Occupational History  . Not on file.   Social History Main Topics  . Smoking status: Never Smoker   . Smokeless tobacco: Not on file  . Alcohol Use: No  . Drug Use: No  . Sexually Active:    Other Topics Concern  . Not on file   Social History Narrative   Retired from [employer]. Divorced: Husband was alcoholic and abandoned her 1950's. 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  Cardiovascular: positive for chest pain, lightheadedness, diaphoresis and shortness of breath, negative for dyspnea on exertion, edema, orthopnea, palpitations, paroxysmal nocturnal dyspnea  Dermatological: negative for rash Respiratory: negative for cough or wheezing Urologic: negative for hematuria Abdominal: negative for nausea, vomiting, diarrhea, bright red blood per rectum, melena, or hematemesis Neurologic: negative for visual changes, syncope, or dizziness All other systems reviewed and are otherwise negative except as noted above.  Physical Exam: Blood pressure 142/74, pulse 62, temperature 98.1 F (36.7 C), temperature source Oral, resp. rate 16, height 5\' 2"  (1.575 m), weight 50.803 kg (112 lb), SpO2 100.00%.    General: Elderly, well nourished, in no acute distress. Head: Normocephalic, atraumatic, sclera non-icteric, no xanthomas Neck: Negative for carotid bruits. JVD not elevated. Lungs: Clear bilaterally to auscultation without wheezes, rales, or rhonchi. Breathing is unlabored. Heart: RRR with S1 S2. No murmurs, rubs, or gallops appreciated. Abdomen: Soft, non-tender, non-distended with normoactive bowel sounds. No hepatomegaly. No rebound/guarding. No obvious abdominal masses. Msk:  Strength and tone appears normal for age. Extremities: No clubbing, cyanosis or edema.  Distal pedal pulses are 2+ and equal bilaterally. Neuro: Alert and oriented X 3. Moves all extremities spontaneously. Psych:  Responds to questions appropriately with a normal affect.  Labs: Recent Labs  Basename 07/15/11 1215   WBC 4.4   HGB 14.0   HCT 43.5   MCV 92.4   PLT 247    Lab 07/15/11 1215  NA 140  K 4.4  CL 103  CO2 30  BUN 13  CREATININE 0.80  CALCIUM 9.8  PROT 6.8  BILITOT 0.4  ALKPHOS 60  ALT 9  AST 20  AMYLASE --  LIPASE --  GLUCOSE 96    Recent  Labs  Basename 07/15/11 1214   CKTOTAL --   CKMB --   CKMBINDEX --   TROPONINI <0.30   Radiology/Studies: Dg Chest 2 View  07/15/2011  *RADIOLOGY REPORT*  Clinical Data: Chest pain  CHEST - 2 VIEW  Comparison: CT chest dated 05/14/2010  Findings: Chronic interstitial markings/emphysematous changes. Left basilar scarring/atelectasis.  No pleural effusion or pneumothorax.  Cardiomediastinal silhouette is within normal limits.  Left subclavian pacemaker.  Degenerative changes of the visualized thoracolumbar spine.  IMPRESSION: No evidence of acute cardiopulmonary disease.  Chronic interstitial markings/emphysematous changes.  Left basilar scarring/atelectasis.  Original Report Authenticated By: Charline Bills, M.D.    EKG: atrial paced rhythm, 68 bpm, no ST-T wave changes  ASSESSMENT AND PLAN:   1. Chest pain- patient was unable to fully describe the episode this morning. The patient has a history of CAD with PCI in 2000, most recent cath in 2007 without need for PCI. She has a family history of cardiac disease. She denies progressively worsening symptoms or prior episodes of  chest pain before this morning. She is very active and denies chest pain, SOB, DOE, lightheadedness, palpitations, orthopnea, PND, LE edema and decline in activity level leading to today's episode. EKG without ischemic changes, POC TnI is negative x 1. There is some reproducible chest pain on exam, but the patient is unable to relate this type of pain to that experienced this morning. Overall, I have a low-suspicion for cardiac source, however given her cardiac history and risk factors, will admit for 24hr observation.  - Will continue to cycle cardiac biomarkers  - Will continue home meds  - Heart healthy diet  - No heparin  - Will add NTG SL PRN  Signed, R. Hurman Horn, PA-C 07/15/2011, 4:54 PM  Cardiology attending  Patient seen and examined. I have reviewed the findings as documented by Mr. Arguello and agree  with his history, physical exam and plan. Will admit the patient for observation and avoid invasive workup unless she rules in for an MI. Continue all home meds. Will hold on heparin unless she has recurrent chest pain.     Lewayne Bunting, M.D.

## 2011-07-15 NOTE — ED Provider Notes (Signed)
History     CSN: [id number]  Arrival date & time 07/15/11  1109   First MD Initiated Contact with Patient 07/15/11 1220      Chief Complaint  Patient presents with  . Chest Pain    The history is provided by the patient and a relative. History Limited By: hx dementia   Pt was seen at 1220.  Per pt and her family, c/o gradual onset and resolution of 1 episode of chest pain that began overnight last night.  Pain radiated into her left arm and was assoc with SOB, nausea, diaphoresis, palpitations and lightheadedness.  Pt further describes her symptoms as "I felt like I was going to die."  Unclear for how long symptoms lasted.  Pt apparently called out to her son 1st, who then told pt's daughter several hours later.  Pt has been symptom-free since her daughter checked on her earlier this morning.  Denies symptoms now.  Pt's family called her PMD and was sent to the ED for further eval.  Denies abd pain, no back pain, no cough.     Past Medical History  Diagnosis Date  . COLONIC POLYPS, ADENOMATOUS   . MITRAL REGURGITATION   . CORONARY, ARTERIOSCLEROSIS   . Irritable bowel syndrome   . ALZHEIMERS DISEASE   . ANXIETY   . HYPERCHOLESTEROLEMIA   . HERNIA, HIATAL, NONCONGENITAL   . Abnormal chest CT 04/2010    RUL nodule    Past Surgical History  Procedure Date  . Appendectomy   . Abdominal hysterectomy 1971  . S/p ptca and stent-80% lad  05/2009  . Pacemaker implanation 07/11/05    Family History  Problem Relation Age of Onset  . Stroke Father     History  Substance Use Topics  . Smoking status: Never Smoker   . Smokeless tobacco: Not on file  . Alcohol Use: No    Review of Systems  Unable to perform ROS: Dementia    Allergies  Diazepam  Home Medications   Current Outpatient Rx  Name Route Sig Dispense Refill  . ASPIRIN 81 MG PO TABS Oral Take 81 mg by mouth daily.      Marland Kitchen DOCUSATE SODIUM 100 MG PO CAPS Oral Take 100 mg by mouth daily.    . DONEPEZIL HCL 10 MG PO  TABS Oral Take 1 tablet (10 mg total) by mouth daily. 30 tablet 6  . ONE DAILY FOR WOMEN 50+ ADV PO Oral Take by mouth daily.        BP 142/74  Pulse 62  Temp(Src) 98.1 F (36.7 C) (Oral)  Resp 16  Ht 5\' 2"  (1.575 m)  Wt 112 lb (50.803 kg)  BMI 20.49 kg/m2  SpO2 100%  Physical Exam 1225: Physical examination:  Nursing notes reviewed; Vital signs and O2 SAT reviewed;  Constitutional: Well developed, Well nourished, In no acute distress; Head:  Normocephalic, atraumatic; Eyes: EOMI, PERRL, No scleral icterus; ENMT: Mouth and pharynx normal, Mucous membranes dry; Neck: Supple, Full range of motion, No lymphadenopathy; Cardiovascular: Regular rate and rhythm, No murmur, rub, or gallop; Respiratory: Breath sounds clear & equal bilaterally, No rales, rhonchi, wheezes, or rub, Normal respiratory effort/excursion; Chest: Nontender, Movement normal; Abdomen: Soft, Nontender, Nondistended, Normal bowel sounds; Extremities: Pulses normal, No tenderness, No edema, No calf edema or asymmetry.; Neuro: Awake, alert, confused re: time, place, events.  No facial droop, speech clear.  Major CN grossly intact.  Moves all ext well without apparent gross focal motor deficits.; Skin: Color normal,  Warm, Dry, no rash.    ED Course  Procedures   1545:  Pt continues to deny any symptoms since arrival to ED.  Confusing historian regarding 1 or 2 episodes of symptoms, symptom duration, etc.  Pacer has been interrogated, continue to see intermittent pacer spikes on monitor.  Signif hx CAD, cardiac stent/PTCA, needs obs admit.  Dx testing d/w pt and family.  Questions answered.  Verb understanding, agreeable to admit.  T/C to [employer], case discussed, including:  HPI, pertinent PM/SHx, VS/PE, dx testing, ED course and treatment.  Agreeable to admit, will come to ED for eval.     MDM  MDM Reviewed: nursing note and vitals Reviewed previous: ECG Interpretation: ECG, labs and x-ray    Date: 07/15/2011  Rate:  72  Rhythm: normal sinus rhythm and atrial paced rhythm  QRS Axis: left  Intervals: normal  ST/T Wave abnormalities: normal  Conduction Disutrbances:none  Narrative Interpretation:   Old EKG Reviewed: unchanged; no significant changes from previous EKG dated 05/14/2010.  Results for orders placed during the hospital encounter of 07/15/11  TROPONIN I      Component Value Range   Troponin I <0.30  <0.30 (ng/mL)  CBC      Component Value Range   WBC 4.4  4.0 - 10.5 (K/uL)   RBC 4.71  3.87 - 5.11 (MIL/uL)   Hemoglobin 14.0  12.0 - 15.0 (g/dL)   HCT 14.7  82.9 - 56.2 (%)   MCV 92.4  78.0 - 100.0 (fL)   MCH 29.7  26.0 - 34.0 (pg)   MCHC 32.2  30.0 - 36.0 (g/dL)   RDW 13.0  86.5 - 78.4 (%)   Platelets 247  150 - 400 (K/uL)  DIFFERENTIAL      Component Value Range   Neutrophils Relative 65  43 - 77 (%)   Neutro Abs 2.9  1.7 - 7.7 (K/uL)   Lymphocytes Relative 28  12 - 46 (%)   Lymphs Abs 1.2  0.7 - 4.0 (K/uL)   Monocytes Relative 6  3 - 12 (%)   Monocytes Absolute 0.3  0.1 - 1.0 (K/uL)   Eosinophils Relative 1  0 - 5 (%)   Eosinophils Absolute 0.1  0.0 - 0.7 (K/uL)   Basophils Relative 0  0 - 1 (%)   Basophils Absolute 0.0  0.0 - 0.1 (K/uL)  COMPREHENSIVE METABOLIC PANEL      Component Value Range   Sodium 140  135 - 145 (mEq/L)   Potassium 4.4  3.5 - 5.1 (mEq/L)   Chloride 103  96 - 112 (mEq/L)   CO2 30  19 - 32 (mEq/L)   Glucose, Bld 96  70 - 99 (mg/dL)   BUN 13  6 - 23 (mg/dL)   Creatinine, Ser 6.96  0.50 - 1.10 (mg/dL)   Calcium 9.8  8.4 - 29.5 (mg/dL)   Total Protein 6.8  6.0 - 8.3 (g/dL)   Albumin 3.6  3.5 - 5.2 (g/dL)   AST 20  0 - 37 (U/L)   ALT 9  0 - 35 (U/L)   Alkaline Phosphatase 60  39 - 117 (U/L)   Total Bilirubin 0.4  0.3 - 1.2 (mg/dL)   GFR calc non Af Amer 63 (*) >90 (mL/min)   GFR calc Af Amer 74 (*) >90 (mL/min)  PROTIME-INR      Component Value Range   Prothrombin Time 13.1  11.6 - 15.2 (seconds)   INR 0.97  0.00 - 1.49  Dg Chest 2  View 07/15/2011  *RADIOLOGY REPORT*  Clinical Data: Chest pain  CHEST - 2 VIEW  Comparison: CT chest dated 05/14/2010  Findings: Chronic interstitial markings/emphysematous changes. Left basilar scarring/atelectasis.  No pleural effusion or pneumothorax.  Cardiomediastinal silhouette is within normal limits.  Left subclavian pacemaker.  Degenerative changes of the visualized thoracolumbar spine.  IMPRESSION: No evidence of acute cardiopulmonary disease.  Chronic interstitial markings/emphysematous changes.  Left basilar scarring/atelectasis.  Original Report Authenticated By: Charline Bills, M.D.               Laray Anger, DO 07/16/11 [phone]

## 2011-07-15 NOTE — ED Notes (Signed)
Pt presents with onset of chest pain that woke her from sleep this morning.  Pt reports pain has been constant and radiates into L arm.  +shortness of breath, especially with exertion.

## 2011-07-15 NOTE — ED Notes (Signed)
Resting quietly on stretcher with family at bedside; no complaints at this time; eating sandwich; awaiting cardiology; pt and family  aware of same

## 2011-07-15 NOTE — Telephone Encounter (Signed)
Noted - pt currently in ER - eval ongoing for same - thanks

## 2011-07-15 NOTE — ED Notes (Signed)
Press photographer at [employer]

## 2011-07-15 NOTE — ED Notes (Signed)
Pt up to desk to speak with staff; appears more relaxed than previously; speech normal with appropriate tone

## 2011-07-16 ENCOUNTER — Other Ambulatory Visit: Payer: Self-pay

## 2011-07-16 ENCOUNTER — Other Ambulatory Visit: Payer: Self-pay | Admitting: Internal Medicine

## 2011-07-16 DIAGNOSIS — R079 Chest pain, unspecified: Secondary | ICD-10-CM

## 2011-07-16 LAB — CARDIAC PANEL(CRET KIN+CKTOT+MB+TROPI)
CK, MB: 2 ng/mL (ref 0.3–4.0)
CK, MB: 2 ng/mL (ref 0.3–4.0)
Troponin I: <0.3 ng/mL (ref <0.30)
Troponin I: <0.3 ng/mL (ref <0.30)

## 2011-07-16 LAB — BASIC METABOLIC PANEL
GFR calc Af Amer: 71 mL/min — ABNORMAL LOW (ref >90)
GFR calc non Af Amer: 62 mL/min — ABNORMAL LOW (ref >90)
Potassium: 3.3 mEq/L — ABNORMAL LOW (ref 3.5–5.1)
Sodium: 140 mEq/L (ref 135–145)

## 2011-07-16 LAB — CBC
Hemoglobin: 13 g/dL (ref 12.0–15.0)
RBC: 4.32 MIL/uL (ref 3.87–5.11)

## 2011-07-16 MED ORDER — ZOLPIDEM TARTRATE 5 MG PO TABS: 5.0000 mg | ORAL_TABLET | Freq: Every evening | ORAL | Status: DC | PRN

## 2011-07-16 MED ORDER — POTASSIUM CHLORIDE CRYS ER 20 MEQ PO TBCR
20.0000 meq | EXTENDED_RELEASE_TABLET | Freq: Two times a day (BID) | ORAL | Status: DC
  Administered 2011-07-16: 20 meq via ORAL
  Filled 2011-07-16: qty 1

## 2011-07-16 MED ORDER — NITROGLYCERIN 0.4 MG SL SUBL: 0.4000 mg | SUBLINGUAL_TABLET | SUBLINGUAL | Status: DC | PRN

## 2011-07-16 NOTE — Telephone Encounter (Signed)
Notified pt rx for ambien call into [facility]... 07/16/11@5 :24pm/LMB

## 2011-07-16 NOTE — Progress Notes (Signed)
UR Completed. Simmons, Lyndy Russman F [phone]  

## 2011-07-16 NOTE — Discharge Instructions (Signed)
PLEASE FOLLOW UP WITH YOUR PRIMARY CARE PROVIDER.

## 2011-07-16 NOTE — Telephone Encounter (Signed)
The pt's daughter Claris Che) called and stated her mother is now out of the hospital and home with her.  She states the pt is very agitated and is not oriented for the majority of the time.  She states her mother was given Ambien 5mg  for bed time to decrease agitation, and she is hoping for a small rx to be sent to the pharmacy to last until their appt on Monday.  Thanks!

## 2011-07-16 NOTE — Telephone Encounter (Signed)
Okay 

## 2011-07-16 NOTE — Progress Notes (Signed)
  Patient Name: Anna [name]      SUBJECTIVE: still with chesp pain that localizes to pacer site  Past Medical History  Diagnosis Date  . COLONIC POLYPS, ADENOMATOUS   . MITRAL REGURGITATION   . CORONARY, ARTERIOSCLEROSIS   . Irritable bowel syndrome   . ALZHEIMERS DISEASE   . ANXIETY   . HYPERCHOLESTEROLEMIA   . HERNIA, HIATAL, NONCONGENITAL   . Abnormal chest CT 04/2010    RUL nodule  . Angina   . H/O hiatal hernia     PHYSICAL EXAM Filed Vitals:   07/15/11 1851 07/15/11 2008 07/15/11 2049 07/16/11 0513  BP: 151/61 167/78 175/74 161/79  Pulse:   67 63  Temp: 97.9 F (36.6 C) 97.5 F (36.4 C) 99 F (37.2 C) 97.4 F (36.3 C)  TempSrc: Oral Oral Oral Oral  Resp:   20 18  Height:   5\' 2"  (1.575 m)   Weight:    119 lb 11.2 oz (54.296 kg)  SpO2: 97%  98% 96%    Well developed and nourished in no acute distress HENT normal Neck supple with JVP-flat Chest wall tenderness near pacer site Clear Regular rate and rhythm, no murmurs or gallops Abd-soft with active BS without hepatomegaly No Clubbing cyanosis edema Skin-warm and dry A    Grossly normal sensory and motor function        Intake/Output Summary (Last 24 hours) at 07/16/11 0741 Last data filed at 07/16/11 0600  Gross per 24 hour  Intake    360 ml  Output    875 ml  Net   -515 ml    LABS: Basic Metabolic Panel:  Lab 07/16/11 9604 07/15/11 2056 07/15/11 1215  NA 140 -- 140  K 3.3* -- 4.4  CL 105 -- 103  CO2 28 -- 30  GLUCOSE 85 -- 96  BUN 14 -- 13  CREATININE 0.82 0.85 0.80  CALCIUM 9.4 -- 9.8  MG -- -- --  PHOS -- -- --   Cardiac Enzymes:  Basename 07/16/11 0245 07/15/11 2057 07/15/11 1214  CKTOTAL 48 98 --  CKMB 2.0 2.2 --  CKMBINDEX -- -- --  TROPONINI <0.30 <0.30 <0.30   CBC:  Lab 07/16/11 0246 07/15/11 2056 07/15/11 1215  WBC 4.5 4.5 4.4  NEUTROABS -- -- 2.9  HGB 13.0 13.0 14.0  HCT 39.5 39.1 43.5  MCV 91.4 91.4 92.4  PLT 208 207 247   PROTIME:  Basename 07/15/11  1215  LABPROT 13.1  INR 0.97   Liver Function Tests:  Basename 07/15/11 1215  AST 20  ALT 9  ALKPHOS 60  BILITOT 0.4  PROT 6.8  ALBUMIN 3.6      ASSESSMENT AND PLAN:  Patient Active Hospital Problem List: Chest pain (07/15/2011) Non cardiac  Will discharge to home with early return followup to PCP No med changes Bloop pressure elevated,  Will defer to PCP  Hypokalemia wil replete   Signed, Sherryl Manges MD  07/16/2011

## 2011-07-16 NOTE — Progress Notes (Signed)
DC IV, DC Tele, DC Home. Discharge instructions and home medications discussed with patient and patient's daughter. Patient and patient's family denies any questions or concerns at this time. Patient leaving unit via wheelchair and does not appear in no acute distress.

## 2011-07-16 NOTE — Discharge Summary (Signed)
Discharge Summary   Patient ID: Anna [name],  MRN: [id number], DOB/AGE: 07/06/1919 78 y.o.  Admit date: 07/15/2011 Discharge date: 07/16/2011  Discharge Diagnoses Principal Problem:  *Chest pain Active Problems:  CORONARY, ARTERIOSCLEROSIS  CONSTIPATION  ALZHEIMERS DISEASE   Allergies Allergies  Allergen Reactions  . Diazepam Other (See Comments)    "slows her respirations and her blood pressure bottoms"   Procedures  None  History of Present Illness  Anna [name] is a 78yo female with PMHx significant for CAD (s/p PCI BMS to mid LAD 05/1998, repeat cath in 02/07 revealed mild in-stent restenosis, otherwise patent), sinus node dysfunction (s/p [employer] PM implantation 02/07), hx of syncope and hiatal hernia who was admitted to [facility] ED yesterday for ACS rule out.  That patient had difficulty describing the episode of chest pain yesterday morning. She reported experiencing chest pain lasting for several minutes yesterday morning around 4am with associated shortness of breath, diaphoresis, lightheadedness and nausea. She denied orthopnea, PND, LE swelling. She called her son complaining of these symptoms. She sat on the side of the bed, drank a cup of coffee and the pain persisted. She did not take any meds to try and alleviate the pain. She reported feeling like she was going to die. She denied recent episodes of chest pain, shortness of breath, DOE, lightheadedness and palpitations. She stated she has experienced this pain prior to having her PM implanted. She is quite active and has not experienced a decrease in her activity level. She denied improvement with position changes. No sick contacts, fevers, chills and she denies prolonged periods of immobility.   She was transported to [facility] ED by her daughter. She had not received any meds while in the ED. The pain had resolved. CXR without acute cardiopulmonary disease, chronic interstitial markings noted with L basilar scarring vs  atelectasis. EKG revealed a-paced rhythm, rate 68 no acute ischemic changes. POC TnI negative. Mild hypertension noted with SBP 140-150 mmHg, otherwise VSS.   PM interrogation reveals no acute event.   Hospital Course   She was subsequently admitted for ACS rule out. Cardiac biomarkers were cycled and all outpatient meds were continued. She was not heparinized. She denied further episodes of chest pain. The following morning, on exam, she did endorse tenderness on palpation of the pacemaker implantation site. This was unchanged. Her cardiac biomarkers returned WNL, and she effectively rule out for ACS. She was found to be mildly hypokalemic at 3.3. This was supplemented.  She is stable, at baseline and will be discharged home today. She will continue her current outpatient medication regimen. She will be given a supply of NTG SL PRN should she have chest pain again, and given her history of CAD. If this relieves her pain, it may suggest a cardiac source. She should have early follow-up with her PCP regarding this hospitalization. This information has been provided for her on the discharge sheet.   Discharge Vitals:  Blood pressure 161/79, pulse 63, temperature 97.4 F (36.3 C), temperature source Oral, resp. rate 18, height 5\' 2"  (1.575 m), weight 54.296 kg (119 lb 11.2 oz), SpO2 96.00%.   Weight change:    Labs: Recent Labs  Basename 07/16/11 0246 07/15/11 2056   WBC 4.5 4.5   HGB 13.0 13.0   HCT 39.5 39.1   MCV 91.4 91.4   PLT 208 207    Lab 07/16/11 0246 07/15/11 2056 07/15/11 1215  NA 140 -- 140  K 3.3* -- 4.4  CL 105 --  103  CO2 28 -- 30  BUN 14 -- 13  CREATININE 0.82 0.85 0.80  CALCIUM 9.4 -- 9.8  PROT -- -- 6.8  BILITOT -- -- 0.4  ALKPHOS -- -- 60  ALT -- -- 9  AST -- -- 20  AMYLASE -- -- --  LIPASE -- -- --  GLUCOSE 85 -- 96    Recent Labs  Basename 07/16/11 0850 07/16/11 0245 07/15/11 2057   CKTOTAL 50 48 98   CKMB 2.0 2.0 2.2   CKMBINDEX -- -- --   TROPONINI  <0.30 <0.30 <0.30   Disposition:  Discharge Orders    Future Appointments: Provider: Department: Dept Phone: Center:   08/29/2011 10:00 AM Rene Paci, MD [facility] [phone] [facility]     Follow-up Information    Schedule an appointment as soon as possible for a visit with Rene Paci, MD.   Contact information:   [address] [place] [address] [phone]         Discharge Medications:  Medication List  As of 07/16/2011 11:29 AM   START taking these medications         nitroGLYCERIN 0.4 MG SL tablet   Commonly known as: NITROSTAT   Place 1 tablet (0.4 mg total) under the tongue every 5 (five) minutes x 3 doses as needed for chest pain.         CONTINUE taking these medications         aspirin 81 MG tablet      docusate sodium 100 MG capsule   Commonly known as: COLACE      donepezil 10 MG tablet   Commonly known as: ARICEPT   Take 1 tablet (10 mg total) by mouth daily.      ONE DAILY FOR WOMEN 50+ ADV PO          Where to get your medications    These are the prescriptions that you need to pick up. We sent them to a specific pharmacy, so you will need to go there to get them.   [facility] - [place] - [address] [address]    [address] [place] [address] [place] [address]    Phone: [phone]        nitroGLYCERIN 0.4 MG SL tablet           Outstanding Labs/Studies: None  Duration of Discharge Encounter: 35 minutes including physician time.  Signed, R. Hurman Horn, PA-C 07/16/2011, 11:29 AM

## 2011-07-21 ENCOUNTER — Encounter: Payer: Self-pay | Admitting: Internal Medicine

## 2011-07-21 ENCOUNTER — Ambulatory Visit (INDEPENDENT_AMBULATORY_CARE_PROVIDER_SITE_OTHER): Payer: Medicare Other | Admitting: Internal Medicine

## 2011-07-21 DIAGNOSIS — F0280 Dementia in other diseases classified elsewhere without behavioral disturbance: Secondary | ICD-10-CM

## 2011-07-21 DIAGNOSIS — I2510 Atherosclerotic heart disease of native coronary artery without angina pectoris: Secondary | ICD-10-CM

## 2011-07-21 DIAGNOSIS — K5900 Constipation, unspecified: Secondary | ICD-10-CM

## 2011-07-21 MED ORDER — DONEPEZIL HCL 10 MG PO TABS: 10.0000 mg | ORAL_TABLET | Freq: Every day | ORAL | Status: DC

## 2011-07-21 NOTE — Progress Notes (Signed)
Subjective:    Patient ID: Anna [name], female    DOB: August 04, 1919, 78 y.o.   MRN: [id number]  HPI  Here for follow up : overnight eval for cp, negative rule out and tele also reviewed chronic medical issues today:  memory loss - mod-adv dementia, progressive symptoms despite aricept, inc dose 09/2010, but noncompliant with meds - care at home by son Fayrene Fearing (retired) -improved with dc of klonopin and dtr feels pt is doing "fine" - declines neuro eval or further testing or other med tx  Hx syncope - hosp for same 04/2010, pacer replacement as below - cardio and neuro eval and imaging unremarkable -- med changes reviewed (decrease in centrally acting BZ doses) - no recurrence -   CAD hx - BMS to prox LAD 05/2009 - reports compliance with ongoing medical treatment and no changes in medication dose or frequency. denies adverse side effects related to current therapy.  dyslipidemia - not on rx med for same - follows low fat diet (supplied by family)  IBS hx, constip predom - constant concern to pt - reports no OP med tx helps - no abd pain or rectal bleeding - no weight loss or diarrhea  RUL nodule on CT chest 04/2010 - family declines further evaluation  Past Medical History  Diagnosis Date  . COLONIC POLYPS, ADENOMATOUS   . MITRAL REGURGITATION   . CORONARY, ARTERIOSCLEROSIS 05/1998    PCI BMS to mid LAD 05/1998, repeat cath in 02/07 revealed mild in-stent restenosis, otherwise patent)  . Irritable bowel syndrome   . ALZHEIMERS DISEASE   . ANXIETY   . HYPERCHOLESTEROLEMIA   . HERNIA, HIATAL, NONCONGENITAL   . Abnormal chest CT 04/2010    RUL nodule  . Angina   . H/O hiatal hernia     Review of Systems  Constitutional: Negative for fever.  Respiratory: Negative for cough and shortness of breath.   Cardiovascular: Negative for chest pain and palpitations.  Neurological: Negative for syncope and headaches.       Objective:   Physical Exam  BP 120/60  Pulse 82  Temp(Src)  97.6 F (36.4 C) (Oral)  Ht 5\' 2"  (1.575 m)  Wt 121 lb 3.2 oz (54.976 kg)  BMI 22.17 kg/m2  SpO2 98% Wt Readings from Last 3 Encounters:  07/21/11 121 lb 3.2 oz (54.976 kg)  07/16/11 119 lb 11.2 oz (54.296 kg)  04/07/11 124 lb 3.2 oz (56.337 kg)   Constitutional: She is well-developed and well-nourished. No distress. Dtr at side Neck: Normal range of motion. Neck supple. No JVD present. No thyromegaly present.  Cardiovascular: Normal rate, regular rhythm and normal heart sounds.  No murmur heard. No BLE edema. Pulmonary/Chest: Effort normal and breath sounds normal. No respiratory distress. She has no wheezes. Neurological: She is alert and oriented to self but not place, purpose or time. No cranial nerve deficit. Coordination and balance normal.  Psychiatric: She has a normal mood and affect. Her behavior is normal. Frequent redirection/reminding   Lab Results  Component Value Date   WBC 4.5 07/16/2011   HGB 13.0 07/16/2011   HCT 39.5 07/16/2011   PLT 208 07/16/2011   CHOL  Value: 189        ATP III CLASSIFICATION:  <200     mg/dL   Desirable  409-811  mg/dL   Borderline High  >=914    mg/dL   High        78/29/5621   TRIG 53 05/12/2010   HDL 65  05/12/2010   ALT 9 07/15/2011   AST 20 07/15/2011   NA 140 07/16/2011   K 3.3* 07/16/2011   CL 105 07/16/2011   CREATININE 0.82 07/16/2011   BUN 14 07/16/2011   CO2 28 07/16/2011   TSH 2.009 05/12/2010   INR 0.97 07/15/2011   HGBA1C  Value: 5.3 (NOTE)                                                                       According to the ADA Clinical Practice Recommendations for 2011, when HbA1c is used as a screening test:   >=6.5%   Diagnostic of Diabetes Mellitus           (if abnormal result  is confirmed)  5.7-6.4%   Increased risk of developing Diabetes Mellitus  References:Diagnosis and Classification of Diabetes Mellitus,Diabetes Care,2011,34(Suppl 1):S62-S69 and Standards of Medical Care in         Diabetes - 2011,Diabetes Care,2011,34   (Suppl 1):S11-S61. 05/11/2010       Assessment & Plan:  See problem list. Medications and labs reviewed today.  Time spent with pt/family today 35 minutes, greater than 50% time spent counseling patient on chest pain obs, progressive dementia and supportive care needs - also medication review and hospital records

## 2011-07-21 NOTE — Assessment & Plan Note (Signed)
Increased aricept 08/2010- but pt is noncompliant with meds Family supportive and involved, attentive to support and comfort rather than aggressive eval

## 2011-07-21 NOTE — Assessment & Plan Note (Signed)
BMS to prox LAD 05/2009 -  On ASA, declines statin Recent 23h obs 06/2011 neg rule out - offered to arrange stress test but family declines The current medical regimen is effective, but encouraged compliance with ASA and NTG prn;  continue present plan and medications.

## 2011-07-21 NOTE — Assessment & Plan Note (Signed)
Chronic IBS-C - primary concern of pt -  Again advised OTC meds for tx same  

## 2011-07-21 NOTE — Patient Instructions (Signed)
It was good to see you today. We have reviewed your hospital records including labs and tests today Medications reviewed, no changes at this time. Refill on medication(s) as discussed today. Please keep scheduled followup as planned or in 6 months, call sooner if problems.

## 2011-08-07 ENCOUNTER — Other Ambulatory Visit: Payer: Self-pay | Admitting: *Deleted

## 2011-08-08 MED ORDER — ZOLPIDEM TARTRATE 5 MG PO TABS: 5.0000 mg | ORAL_TABLET | Freq: Every day | ORAL | Status: DC

## 2011-08-08 NOTE — Telephone Encounter (Signed)
Faxed script back to [facility]... 08/08/11@8 :29am/LMB

## 2011-08-25 ENCOUNTER — Ambulatory Visit: Payer: Medicare Other | Admitting: Internal Medicine

## 2011-08-29 ENCOUNTER — Encounter: Payer: Self-pay | Admitting: Internal Medicine

## 2011-08-29 ENCOUNTER — Telehealth: Payer: Self-pay | Admitting: Internal Medicine

## 2011-08-29 ENCOUNTER — Ambulatory Visit: Payer: Medicare Other | Admitting: Internal Medicine

## 2011-08-29 NOTE — Telephone Encounter (Signed)
08-29-11 sent pt past due letter, was due with klein in November/mt

## 2011-09-11 ENCOUNTER — Ambulatory Visit: Payer: Medicare Other | Admitting: Internal Medicine

## 2011-09-15 ENCOUNTER — Ambulatory Visit: Payer: Medicare Other | Admitting: Internal Medicine

## 2011-09-18 ENCOUNTER — Encounter: Payer: Self-pay | Admitting: Internal Medicine

## 2011-09-18 ENCOUNTER — Ambulatory Visit (INDEPENDENT_AMBULATORY_CARE_PROVIDER_SITE_OTHER): Payer: Medicaid Other | Admitting: Internal Medicine

## 2011-09-18 ENCOUNTER — Other Ambulatory Visit (INDEPENDENT_AMBULATORY_CARE_PROVIDER_SITE_OTHER): Payer: Medicare Other

## 2011-09-18 VITALS — BP 120/72 | HR 89 | Temp 98.7°F | Ht 62.0 in | Wt 118.4 lb

## 2011-09-18 DIAGNOSIS — G309 Alzheimer's disease, unspecified: Secondary | ICD-10-CM

## 2011-09-18 DIAGNOSIS — R634 Abnormal weight loss: Secondary | ICD-10-CM

## 2011-09-18 DIAGNOSIS — E7800 Pure hypercholesterolemia, unspecified: Secondary | ICD-10-CM

## 2011-09-18 DIAGNOSIS — I2510 Atherosclerotic heart disease of native coronary artery without angina pectoris: Secondary | ICD-10-CM

## 2011-09-18 DIAGNOSIS — F0280 Dementia in other diseases classified elsewhere without behavioral disturbance: Secondary | ICD-10-CM

## 2011-09-18 LAB — LIPID PANEL
Cholesterol: 220 mg/dL — ABNORMAL HIGH (ref 0–200)
HDL: 86 mg/dL (ref >39.00)
VLDL: 9.8 mg/dL (ref 0.0–40.0)

## 2011-09-18 NOTE — Patient Instructions (Signed)
It was good to see you today. Test(s) ordered today. Your results will be called to you after review (48-72hours after test completion). If any changes need to be made, you will be notified at that time. Medications reviewed, no changes at this time. Please schedule followup in 6 months, call sooner if problems.  

## 2011-09-18 NOTE — Assessment & Plan Note (Signed)
Not on statin since 2010 Check lipids now due to CAD hx Continue low fat diet and consider treatment if not at goal

## 2011-09-18 NOTE — Assessment & Plan Note (Signed)
Increased aricept 06/2011- Family supportive and involved, attentive to support and comfort rather than aggressive eval  

## 2011-09-18 NOTE — Progress Notes (Signed)
Subjective:    Patient ID: Anna [name], female    DOB: 05/23/1920, 78 y.o.   MRN: [id number]  HPI  Here for follow up - reviewed chronic medical issues today:  memory loss - mod-adv dementia, progressive symptoms despite aricept, inc dose 06/2011 - care at home by son Fayrene Fearing (retired), sleep and wandering has improved with prn Ambein -did not have same result with klonopin - dtr feels pt is doing "fine" - declines neuro eval or further testing or other med tx  Hx syncope - hosp for same 04/2010, pacer replacement - cardio and neuro eval and imaging unremarkable -- med changes reviewed - no recurrence -   CAD hx - BMS to prox LAD 05/1998 - reports compliance with ongoing medical treatment and no changes in medication dose or frequency. denies adverse side effects related to current therapy.   PPM 2007 due to syncope  dyslipidemia - not on rx med for same - follows low fat diet (supplied by family)  IBS hx, constip predom - constant concern to patient - reports no OP med tx helps - no abdominal pain or rectal bleeding - ongoing weight loss reviewed  RUL nodule on CT chest 04/2010 - family declines further evaluation  Past Medical History  Diagnosis Date  . COLONIC POLYPS, ADENOMATOUS   . MITRAL REGURGITATION   . CORONARY, ARTERIOSCLEROSIS 05/1998    PCI BMS to mid LAD 05/1998, repeat cath in 02/07 revealed mild in-stent restenosis, otherwise patent)  . Irritable bowel syndrome   . ALZHEIMERS DISEASE   . ANXIETY   . HYPERCHOLESTEROLEMIA   . HERNIA, HIATAL, NONCONGENITAL   . Abnormal chest CT 04/2010    RUL nodule  . Angina   . H/O hiatal hernia     Review of Systems  Constitutional: Negative for fever.  Respiratory: Negative for cough and shortness of breath.   Cardiovascular: Negative for chest pain and palpitations.  Neurological: Negative for syncope and headaches.       Objective:   Physical Exam  BP 120/72  Pulse 89  Temp(Src) 98.7 F (37.1 C) (Oral)  Ht 5\' 2"   (1.575 m)  Wt 118 lb 6.4 oz (53.706 kg)  BMI 21.66 kg/m2  SpO2 98% Wt Readings from Last 3 Encounters:  09/18/11 118 lb 6.4 oz (53.706 kg)  07/21/11 121 lb 3.2 oz (54.976 kg)  07/16/11 119 lb 11.2 oz (54.296 kg)   Constitutional: She is well-developed and well-nourished. No distress. Dtr at side Neck: Normal range of motion. Neck supple. No JVD present. No thyromegaly present.  Cardiovascular: Normal rate, regular rhythm and normal heart sounds.  No murmur heard. No BLE edema. Pulmonary/Chest: Effort normal and breath sounds normal. No respiratory distress. She has no wheezes. Neurological: She is alert and oriented to self but not place, purpose or time. No cranial nerve deficit. Coordination and balance normal. No dysarthria Psychiatric: She has a normal mood and affect. Her behavior is normal. Frequent redirection/reminding   Lab Results  Component Value Date   WBC 4.5 07/16/2011   HGB 13.0 07/16/2011   HCT 39.5 07/16/2011   PLT 208 07/16/2011   CHOL  Value: 189        ATP III CLASSIFICATION:  <200     mg/dL   Desirable  782-956  mg/dL   Borderline High  >=213    mg/dL   High        08/65/7846   TRIG 53 05/12/2010   HDL 65 05/12/2010   ALT 9  07/15/2011   AST 20 07/15/2011   NA 140 07/16/2011   K 3.3* 07/16/2011   CL 105 07/16/2011   CREATININE 0.82 07/16/2011   BUN 14 07/16/2011   CO2 28 07/16/2011   TSH 2.009 05/12/2010   INR 0.97 07/15/2011   HGBA1C  Value: 5.3 (NOTE)                                                                       According to the ADA Clinical Practice Recommendations for 2011, when HbA1c is used as a screening test:   >=6.5%   Diagnostic of Diabetes Mellitus           (if abnormal result  is confirmed)  5.7-6.4%   Increased risk of developing Diabetes Mellitus  References:Diagnosis and Classification of Diabetes Mellitus,Diabetes Care,2011,34(Suppl 1):S62-S69 and Standards of Medical Care in         Diabetes - 2011,Diabetes Care,2011,34  (Suppl 1):S11-S61.  05/11/2010       Assessment & Plan:  See problem list. Medications and labs reviewed today.

## 2011-09-18 NOTE — Assessment & Plan Note (Signed)
BMS to prox LAD 05/1998 -  S/p PPM 2007 due to syncope events On ASA - declines other meds s/p 23h obs 06/2011 neg rule out - offered to arrange stress test but family declines The current medical regimen is effective continue present plan and medications.

## 2011-10-06 ENCOUNTER — Other Ambulatory Visit: Payer: Self-pay | Admitting: *Deleted

## 2011-10-06 MED ORDER — ZOLPIDEM TARTRATE 5 MG PO TABS: 5.0000 mg | ORAL_TABLET | Freq: Every day | ORAL | Status: DC

## 2011-10-06 NOTE — Telephone Encounter (Signed)
Faxed script back to [facility]... 10/06/11@2 :34pm/LMB

## 2011-10-31 ENCOUNTER — Telehealth: Payer: Self-pay | Admitting: Internal Medicine

## 2011-10-31 NOTE — Telephone Encounter (Signed)
ER CALL. Caller: Margaret/Child; PCP: Rene Paci; CB#: [phone]; ; ; Call regarding SOB, Irregular Heart Beat.  Pt is complaining of palpating HB w/ constant dizziness and generalized weakness.  Pt unable to stand w/o assistance.  Pt is diaphoric, denies CP.  Advised Caller to take Pt to ED immediately d/t severe dizziness unable to stand w/o assistance.  Caller verbalized understanding.  Pt denies 911. BP 190/88

## 2011-11-03 NOTE — Telephone Encounter (Signed)
Noted thanks °

## 2011-11-20 ENCOUNTER — Encounter: Payer: Self-pay | Admitting: Internal Medicine

## 2011-11-20 ENCOUNTER — Ambulatory Visit (INDEPENDENT_AMBULATORY_CARE_PROVIDER_SITE_OTHER): Payer: Medicare Other | Admitting: Internal Medicine

## 2011-11-20 ENCOUNTER — Ambulatory Visit: Payer: Medicare Other

## 2011-11-20 VITALS — BP 140/70 | HR 86 | Temp 98.1°F | Ht 62.0 in | Wt 118.6 lb

## 2011-11-20 DIAGNOSIS — F0280 Dementia in other diseases classified elsewhere without behavioral disturbance: Secondary | ICD-10-CM

## 2011-11-20 DIAGNOSIS — R55 Syncope and collapse: Secondary | ICD-10-CM

## 2011-11-20 DIAGNOSIS — G309 Alzheimer's disease, unspecified: Secondary | ICD-10-CM

## 2011-11-20 DIAGNOSIS — I2510 Atherosclerotic heart disease of native coronary artery without angina pectoris: Secondary | ICD-10-CM

## 2011-11-20 DIAGNOSIS — R002 Palpitations: Secondary | ICD-10-CM

## 2011-11-20 LAB — CBC WITH DIFFERENTIAL/PLATELET
Basophils Relative: 0.5 % (ref 0.0–3.0)
Eosinophils Absolute: 0 10*3/uL (ref 0.0–0.7)
HCT: 42.4 % (ref 36.0–46.0)
Lymphs Abs: 1.2 10*3/uL (ref 0.7–4.0)
MCHC: 33.2 g/dL (ref 30.0–36.0)
MCV: 92.1 fl (ref 78.0–100.0)
Monocytes Absolute: 0.2 10*3/uL (ref 0.1–1.0)
Neutrophils Relative %: 58 % (ref 43.0–77.0)
RBC: 4.6 Mil/uL (ref 3.87–5.11)

## 2011-11-20 LAB — CARDIAC PANEL: CK-MB: 1.1 ng/mL (ref 0.3–4.0)

## 2011-11-20 LAB — BASIC METABOLIC PANEL
BUN: 14 mg/dL (ref 6–23)
CO2: 30 mEq/L (ref 19–32)
Chloride: 106 mEq/L (ref 96–112)
Creatinine, Ser: 0.9 mg/dL (ref 0.4–1.2)

## 2011-11-20 LAB — TSH: TSH: 3.58 u[IU]/mL (ref 0.35–5.50)

## 2011-11-20 MED ORDER — ASPIRIN EC 325 MG PO TBEC: 325.0000 mg | DELAYED_RELEASE_TABLET | Freq: Every day | ORAL | Status: DC

## 2011-11-20 NOTE — Patient Instructions (Signed)
It was good to see you today. Increase aspirin to 325 mg daily - no other medication changes Test(s) ordered today. Your results will be called to you after review (48-72hours after test completion). If any changes need to be made, you will be notified at that time. we'll make referral for cardiac monitor to look for palpitation arrythmia. Our office will contact you regarding appointment(s) once made. Please schedule followup in 2 weeks, call sooner if problems.

## 2011-11-20 NOTE — Assessment & Plan Note (Signed)
Increased aricept 06/2011- Family supportive and involved, attentive to support and comfort rather than aggressive eval  

## 2011-11-20 NOTE — Assessment & Plan Note (Addendum)
BMS to prox LAD 05/1998 -  S/p PPM 2007 due to syncope events - no plans to replace battery per family, even if needed On ASA - declines other meds s/p 23h obs 06/2011 neg rule out - offered to arrange stress test but family declines Increase ASA to 325 qd - see above

## 2011-11-20 NOTE — Progress Notes (Signed)
  Subjective:    Patient ID: Anna [name], female    DOB: 04-Nov-1919, 78 y.o.   MRN: [id number]  HPI  complains of near syncope Onset last 2-3 days associated with dizzy sensation, dyspnea on exertion and palpitations symptoms last 5-10 minutes per spell  Past Medical History  Diagnosis Date  . COLONIC POLYPS, ADENOMATOUS   . MITRAL REGURGITATION   . CORONARY, ARTERIOSCLEROSIS 05/1998    PCI BMS to mid LAD 05/1998, repeat cath in 02/07 revealed mild in-stent restenosis, otherwise patent)  . Irritable bowel syndrome   . ALZHEIMERS DISEASE   . ANXIETY   . HYPERCHOLESTEROLEMIA   . HERNIA, HIATAL, NONCONGENITAL   . Abnormal chest CT 04/2010    RUL nodule  . Angina   . H/O hiatal hernia     Review of Systems  Constitutional: Positive for fatigue. Negative for fever.  Respiratory: Negative for cough and wheezing.   Cardiovascular: Positive for palpitations. Negative for leg swelling.  Neurological: Positive for dizziness. Negative for tremors, seizures, syncope, facial asymmetry, speech difficulty, numbness and headaches.       Objective:   Physical Exam BP 140/70  Pulse 86  Temp 98.1 F (36.7 C) (Oral)  Ht 5\' 2"  (1.575 m)  Wt 118 lb 9.6 oz (53.797 kg)  BMI 21.69 kg/m2  SpO2 95% Wt Readings from Last 3 Encounters:  11/20/11 118 lb 9.6 oz (53.797 kg)  09/18/11 118 lb 6.4 oz (53.706 kg)  07/21/11 121 lb 3.2 oz (54.976 kg)   Constitutional: She appears well-developed and well-nourished. No distress. Dtr at side Neck: Normal range of motion. Neck supple. No JVD present. No thyromegaly present.  Cardiovascular: Normal rate, regular rhythm and normal heart sounds.  No murmur heard. No BLE edema. Pulmonary/Chest: Effort normal and breath sounds normal. No respiratory distress. She has no wheezes.  Neurological: She is alert and oriented to person, but not to place or time. No cranial nerve deficit. Coordination and speech normal.  Psychiatric: She has a normal mood and  affect. Her behavior is normal. Judgment and thought content normal.   Lab Results  Component Value Date   WBC 4.5 07/16/2011   HGB 13.0 07/16/2011   HCT 39.5 07/16/2011   PLT 208 07/16/2011   GLUCOSE 85 07/16/2011   CHOL 220* 09/18/2011   TRIG 49.0 09/18/2011   HDL 86.00 09/18/2011   LDLDIRECT 119.9 09/18/2011   LDLCALC  Value: 113        * 05/12/2010   ALT 9 07/15/2011   AST 20 07/15/2011   NA 140 07/16/2011   K 3.3* 07/16/2011   CL 105 07/16/2011   CREATININE 0.82 07/16/2011   BUN 14 07/16/2011   CO2 28 07/16/2011   TSH 3.56 09/18/2011   INR 0.97 07/15/2011   HGBA1C  Value: 5.3 (NOTE). 05/11/2010    ECG: sinus @ 75 bpm - old anterior MI (unchanged from 06/2011 ECG in EMR from hosp for rule out)     Assessment & Plan:  Near syncope with paroxysmal palpitations last 48h hx CAD Fatigue Advanced dementia  Check ECG and labs including cardiac enz Arrange holter monitor - ?PAF HD stable and asymptomatic at this time Increase ASA to 325 mg qd Discussion with dtr again: comfort care is goal and avoid hospitalization even if means pt death  Verified pt in no pain or distress at this time

## 2011-11-28 ENCOUNTER — Encounter (INDEPENDENT_AMBULATORY_CARE_PROVIDER_SITE_OTHER): Payer: Medicare Other

## 2011-11-28 DIAGNOSIS — R55 Syncope and collapse: Secondary | ICD-10-CM

## 2011-12-04 ENCOUNTER — Encounter: Payer: Self-pay | Admitting: Internal Medicine

## 2011-12-04 ENCOUNTER — Ambulatory Visit (INDEPENDENT_AMBULATORY_CARE_PROVIDER_SITE_OTHER): Payer: Medicare Other | Admitting: Internal Medicine

## 2011-12-04 VITALS — BP 120/72 | HR 87 | Temp 98.5°F | Ht 62.0 in | Wt 115.4 lb

## 2011-12-04 DIAGNOSIS — F410 Panic disorder [episodic paroxysmal anxiety] without agoraphobia: Secondary | ICD-10-CM

## 2011-12-04 DIAGNOSIS — F411 Generalized anxiety disorder: Secondary | ICD-10-CM

## 2011-12-04 MED ORDER — ASPIRIN 81 MG PO TBEC: 81.0000 mg | DELAYED_RELEASE_TABLET | Freq: Every day | ORAL | Status: DC

## 2011-12-04 MED ORDER — ALPRAZOLAM 0.25 MG PO TABS: 0.1250 mg | ORAL_TABLET | Freq: Two times a day (BID) | ORAL | Status: DC | PRN

## 2011-12-04 MED ORDER — PAROXETINE HCL 10 MG PO TABS: 10.0000 mg | ORAL_TABLET | ORAL | Status: DC

## 2011-12-04 MED ORDER — ZOLPIDEM TARTRATE 5 MG PO TABS: 5.0000 mg | ORAL_TABLET | Freq: Every day | ORAL | Status: DC

## 2011-12-04 NOTE — Progress Notes (Signed)
Subjective:    Patient ID: Anna [name], female    DOB: 07-12-1919, 78 y.o.   MRN: [id number]  HPI   Here for follow up - near syncope   Past Medical History  Diagnosis Date  . COLONIC POLYPS, ADENOMATOUS   . MITRAL REGURGITATION   . CORONARY, ARTERIOSCLEROSIS 05/1998    PCI BMS to mid LAD 05/1998, repeat cath in 02/07 revealed mild in-stent restenosis, otherwise patent)  . Irritable bowel syndrome   . ALZHEIMERS DISEASE   . ANXIETY   . HYPERCHOLESTEROLEMIA   . HERNIA, HIATAL, NONCONGENITAL   . Abnormal chest CT 04/2010    RUL nodule  . Angina   . H/O hiatal hernia     Review of Systems  Constitutional: Positive for fatigue. Negative for fever.  Respiratory: Negative for cough and wheezing.   Cardiovascular: Positive for palpitations. Negative for leg swelling.  Neurological: Positive for dizziness. Negative for tremors, seizures, syncope, facial asymmetry, speech difficulty, numbness and headaches.       Objective:   Physical Exam  BP 120/72  Pulse 87  Temp 98.5 F (36.9 C) (Oral)  Ht 5\' 2"  (1.575 m)  Wt 115 lb 6.4 oz (52.345 kg)  BMI 21.11 kg/m2  SpO2 98% Wt Readings from Last 3 Encounters:  12/04/11 115 lb 6.4 oz (52.345 kg)  11/20/11 118 lb 9.6 oz (53.797 kg)  09/18/11 118 lb 6.4 oz (53.706 kg)   Constitutional: She appears well-developed and well-nourished. No distress. Dtr at side Neck: Normal range of motion. Neck supple. No JVD present. No thyromegaly present.  Cardiovascular: Normal rate, regular rhythm and normal heart sounds.  No murmur heard. No BLE edema. Pulmonary/Chest: Effort normal and breath sounds normal. No respiratory distress. She has no wheezes.  Neurological: She is alert and oriented to person, but not to place or time. No cranial nerve deficit. Coordination and speech normal.  Psychiatric: She has a dysphoric mood and simultaneous anxious/perseverating affect. Her behavior is normal   Lab Results  Component Value Date   WBC 3.5*  11/20/2011   HGB 14.1 11/20/2011   HCT 42.4 11/20/2011   PLT 190.0 11/20/2011   GLUCOSE 87 11/20/2011   CHOL 220* 09/18/2011   TRIG 49.0 09/18/2011   HDL 86.00 09/18/2011   LDLDIRECT 119.9 09/18/2011   LDLCALC  Value: 113        Total Cholesterol/HDL:CHD Risk Coronary Heart Disease Risk Table                     Men   Women  1/2 Average Risk   3.4   3.3  Average Risk       5.0   4.4  2 X Average Risk   9.6   7.1  3 X Average Risk  23.4   11.0        Use the calculated Patient Ratio above and the CHD Risk Table to determine the patient's CHD Risk.        ATP III CLASSIFICATION (LDL):  <100     mg/dL   Optimal  409-811  mg/dL   Near or Above                    Optimal  130-159  mg/dL   Borderline  914-782  mg/dL   High  >956     mg/dL   Very High* 21/30/8657   ALT 9 07/15/2011   AST 20 07/15/2011   NA 143 11/20/2011   K  5.2* 11/20/2011   CL 106 11/20/2011   CREATININE 0.9 11/20/2011   BUN 14 11/20/2011   CO2 30 11/20/2011   TSH 3.58 11/20/2011   INR 0.97 07/15/2011   HGBA1C  Value: 5.3 (NOTE)                                                                       According to the ADA Clinical Practice Recommendations for 2011, when HbA1c is used as a screening test:   >=6.5%   Diagnostic of Diabetes Mellitus           (if abnormal result  is confirmed)  5.7-6.4%   Increased risk of developing Diabetes Mellitus  References:Diagnosis and Classification of Diabetes Mellitus,Diabetes Care,2011,34(Suppl 1):S62-S69 and Standards of Medical Care in         Diabetes - 2011,Diabetes Care,2011,34  (Suppl 1):S11-S61. 05/11/2010         Assessment & Plan:  Near syncope with paroxysmal palpitations since 10/2011 holter 7/13 neg for arrythmia - labs unremarkable Suspect anxiety related and increasing depression symptoms  Advanced dementia  Start paxil and use low dose xanax as needed Call if symptoms worse or unimproved Ok to reduce ASA back to 81mg /d Discussion with dtr again: comfort care is goal and avoid  hospitalization even if means pt death  Verified pt in no pain or distress at this time

## 2011-12-04 NOTE — Assessment & Plan Note (Signed)
Resume low dose BZs prn and start paxil  reassurance and redirection provided Support to pt and family

## 2011-12-04 NOTE — Patient Instructions (Addendum)
It was good to see you today. We have reviewed your prior records including labs and tests today Start paxil every AM AND use xanax if needed for additional panic attack or "nerves" symptoms Other Medications reviewed, no changes at this time. Resume baby aspiin 81 mg daily Refill on medication(s) as discussed today. Keep follow up as scheduled, call sooner if problems

## 2011-12-26 ENCOUNTER — Telehealth: Payer: Self-pay | Admitting: Internal Medicine

## 2011-12-26 NOTE — Telephone Encounter (Signed)
12-26-11 lmm @ 431pm for pt to set up pas due pacer ck with klein/brooke/mt

## 2012-01-05 ENCOUNTER — Other Ambulatory Visit: Payer: Self-pay | Admitting: *Deleted

## 2012-01-05 MED ORDER — ALPRAZOLAM 0.25 MG PO TABS: 0.1250 mg | ORAL_TABLET | Freq: Two times a day (BID) | ORAL | Status: AC | PRN

## 2012-01-05 NOTE — Telephone Encounter (Signed)
Faxed script back to [facility]... 01/05/12@11 :55am/LMB

## 2012-02-20 ENCOUNTER — Encounter (HOSPITAL_COMMUNITY): Payer: Self-pay | Admitting: Family Medicine

## 2012-02-20 ENCOUNTER — Emergency Department (HOSPITAL_COMMUNITY)
Admission: EM | Admit: 2012-02-20 | Discharge: 2012-02-20 | Disposition: A | Discharge status: Home / Self Care | Payer: Medicare Other | Attending: Emergency Medicine | Admitting: Emergency Medicine

## 2012-02-20 DIAGNOSIS — I2510 Atherosclerotic heart disease of native coronary artery without angina pectoris: Secondary | ICD-10-CM | POA: Insufficient documentation

## 2012-02-20 DIAGNOSIS — G309 Alzheimer's disease, unspecified: Secondary | ICD-10-CM | POA: Insufficient documentation

## 2012-02-20 DIAGNOSIS — IMO0002 Reserved for concepts with insufficient information to code with codable children: Secondary | ICD-10-CM | POA: Insufficient documentation

## 2012-02-20 DIAGNOSIS — E7800 Pure hypercholesterolemia, unspecified: Secondary | ICD-10-CM | POA: Insufficient documentation

## 2012-02-20 DIAGNOSIS — F0280 Dementia in other diseases classified elsewhere without behavioral disturbance: Secondary | ICD-10-CM | POA: Insufficient documentation

## 2012-02-20 DIAGNOSIS — W64XXXA Exposure to other animate mechanical forces, initial encounter: Secondary | ICD-10-CM | POA: Insufficient documentation

## 2012-02-20 DIAGNOSIS — Z9861 Coronary angioplasty status: Secondary | ICD-10-CM | POA: Insufficient documentation

## 2012-02-20 DIAGNOSIS — F17200 Nicotine dependence, unspecified, uncomplicated: Secondary | ICD-10-CM | POA: Insufficient documentation

## 2012-02-20 NOTE — ED Provider Notes (Signed)
History     CSN: [id number]  Arrival date & time 02/20/12  1543   First MD Initiated Contact with Patient 02/20/12 1747      Chief Complaint  Patient presents with  . Laceration    (Consider location/radiation/quality/duration/timing/severity/associated sxs/prior treatment) HPI This 78 year old female accidentally scratched by her dog on the patient's left forearm resulting in a superficial skin tear. The patient's tetanus shot is up-to-date about a year ago. Patient has no pain. She is no weakness or numbness. There is no active bleeding. The family just wants to make sure that the patient does not need stitches because the family does not think the patient should have stitches to this wound or needs stitches but just want to make sure. The Pt has no complaints. there was no fall chest pain shortness breath abdominal pain neck pain back pain bony pain joint pain or other concerns.  Past Medical History  Diagnosis Date  . COLONIC POLYPS, ADENOMATOUS   . MITRAL REGURGITATION   . CORONARY, ARTERIOSCLEROSIS 05/1998    PCI BMS to mid LAD 05/1998, repeat cath in 02/07 revealed mild in-stent restenosis, otherwise patent)  . Irritable bowel syndrome   . ALZHEIMERS DISEASE   . ANXIETY   . HYPERCHOLESTEROLEMIA   . HERNIA, HIATAL, NONCONGENITAL   . Abnormal chest CT 04/2010    RUL nodule  . Angina   . H/O hiatal hernia     Past Surgical History  Procedure Date  . Coronary angioplasty with stent placement 06/04/1998    "1"; LAD  . Insert / replace / remove pacemaker 2007    initial placement; [employer]  . Abdominal hysterectomy 1965  . Appendectomy 1965    w/hyster  . Cataract extraction w/ intraocular lens implant     left    Family History  Problem Relation Age of Onset  . Stroke Father   . Heart disease Mother   . Heart disease Father   . Heart disease Brother   . Heart disease Sister     History  Substance Use Topics  . Smoking status: Never Smoker   . Smokeless  tobacco: Current User    Types: Chew  . Alcohol Use: No    OB History    Grav Para Term Preterm Abortions TAB SAB Ect Mult Living                  Review of Systems 10 Systems reviewed and are negative for acute change except as noted in the HPI. Allergies  Diazepam  Home Medications   Current Outpatient Rx  Name Route Sig Dispense Refill  . ASPIRIN 81 MG PO TBEC Oral Take 81 mg by mouth daily.    Marland Kitchen DOCUSATE SODIUM 100 MG PO CAPS Oral Take 100 mg by mouth daily.    . DONEPEZIL HCL 10 MG PO TABS Oral Take 10 mg by mouth at bedtime.    . ONE DAILY FOR WOMEN 50+ ADV PO Oral Take 1 tablet by mouth daily.     Marland Kitchen NITROGLYCERIN 0.4 MG SL SUBL Sublingual Place 0.4 mg under the tongue every 5 (five) minutes x 3 doses as needed. For chest pain    . ZOLPIDEM TARTRATE 5 MG PO TABS Oral Take 5 mg by mouth at bedtime as needed. For sleep      There were no vitals taken for this visit.  Physical Exam  Nursing note and vitals reviewed. Constitutional:       Awake, alert, nontoxic appearance.  HENT:  Head: Atraumatic.  Eyes: Right eye exhibits no discharge. Left eye exhibits no discharge.  Neck: Neck supple.  Pulmonary/Chest: Effort normal. She exhibits no tenderness.  Abdominal: Soft. There is no tenderness. There is no rebound.  Musculoskeletal: She exhibits no tenderness.       Baseline ROM, no obvious new focal weakness.  Right arm and both legs nontender. Left arm nontender. Left hand nontender with capillary refill less than 2 seconds normal light touch good range of motion no obvious weakness. Left forearm with several cm superficial skin tear with no foreign body noted no significant deep structure involvement noted; not amenable to sutures.  Neurological: She is alert.       Mental status and motor strength appears baseline for patient and situation.  Skin: No rash noted.  Psychiatric: She has a normal mood and affect.    ED Course  Procedures (including critical care  time)  Labs Reviewed - No data to display No results found.   1. Skin tear       MDM  Pt stable in ED with no significant deterioration in condition.Patient / Family / Caregiver informed of clinical course, understand medical decision-making process, and agree with plan.I doubt any other EMC precluding discharge.        Hurman Horn, MD 02/21/12 2017

## 2012-02-20 NOTE — ED Notes (Signed)
Per pt her dog was playing and scratched her arm. sts some torn skin. Pt arm wrapped. Per family rinsed with saline.

## 2012-03-18 ENCOUNTER — Encounter: Payer: Self-pay | Admitting: Internal Medicine

## 2012-03-18 ENCOUNTER — Ambulatory Visit (INDEPENDENT_AMBULATORY_CARE_PROVIDER_SITE_OTHER): Payer: 59 | Admitting: Internal Medicine

## 2012-03-18 VITALS — BP 120/70 | HR 85 | Temp 98.2°F | Ht 62.0 in | Wt 118.0 lb

## 2012-03-18 DIAGNOSIS — F411 Generalized anxiety disorder: Secondary | ICD-10-CM

## 2012-03-18 DIAGNOSIS — F0280 Dementia in other diseases classified elsewhere without behavioral disturbance: Secondary | ICD-10-CM

## 2012-03-18 DIAGNOSIS — G309 Alzheimer's disease, unspecified: Secondary | ICD-10-CM

## 2012-03-18 DIAGNOSIS — Z23 Encounter for immunization: Secondary | ICD-10-CM

## 2012-03-18 MED ORDER — PAROXETINE HCL 20 MG PO TABS: 20.0000 mg | ORAL_TABLET | ORAL | Status: DC

## 2012-03-18 NOTE — Assessment & Plan Note (Signed)
started paxil 11/2009, titrate up now as improved but not at goal reassurance and redirection provided Support to pt and family

## 2012-03-18 NOTE — Patient Instructions (Signed)
It was good to see you today. We have reviewed your prior records including labs and tests today Increase dose paxil now - also ok to use xanax if needed for additional panic attack or "nerves" symptoms Other Medications reviewed, no changes at this time.  Refill on medication(s) as discussed today. Please schedule followup in 6 months, call sooner if problems.

## 2012-03-18 NOTE — Progress Notes (Signed)
Subjective:    Patient ID: Anna [name], female    DOB: 1919/09/10, 78 y.o.   MRN: [id number]  HPI  Here for follow up - reviewed chronic medical issues today:  memory loss - mod-adv dementia, progressive symptoms despite aricept, inc to max dose 06/2011 - care at home by son Fayrene Fearing (retired), sleep and wandering somewhat improved with prn Ambein -did not have same result with klonopin - dtr feels pt is doing "fine" - declines neuro eval or further testing or other med tx  Hx syncope - hosp for same 04/2010, pacer replacement - cardio and neuro eval and imaging unremarkable -- med changes reviewed - no recurrence -   CAD hx - BMS to prox LAD 05/1998 - reports compliance with ongoing medical treatment and no changes in medication dose or frequency. denies adverse side effects related to current therapy.   PPM 2007 due to syncope  dyslipidemia - not on rx med for same - follows low fat diet (supplied by family)  IBS hx, constip predom - constant concern to patient - reports no OP med tx helps - no abdominal pain or rectal bleeding - ongoing weight loss reviewed  RUL nodule on CT chest 04/2010 - family declines further evaluation  Past Medical History  Diagnosis Date  . COLONIC POLYPS, ADENOMATOUS   . MITRAL REGURGITATION   . CORONARY, ARTERIOSCLEROSIS 05/1998    PCI BMS to mid LAD 05/1998, repeat cath in 02/07 revealed mild in-stent restenosis, otherwise patent)  . Irritable bowel syndrome   . ALZHEIMERS DISEASE   . ANXIETY   . HYPERCHOLESTEROLEMIA   . HERNIA, HIATAL, NONCONGENITAL   . Abnormal chest CT 04/2010    RUL nodule  . Angina   . H/O hiatal hernia     Review of Systems  Constitutional: Negative for fever.  Respiratory: Negative for cough and shortness of breath.   Cardiovascular: Negative for chest pain and palpitations.  Neurological: Negative for syncope and headaches.       Objective:   Physical Exam  BP 120/70  Pulse 85  Temp 98.2 F (36.8 C) (Oral)  Ht  5\' 2"  (1.575 m)  Wt 118 lb (53.524 kg)  BMI 21.58 kg/m2  SpO2 96% Wt Readings from Last 3 Encounters:  03/18/12 118 lb (53.524 kg)  12/04/11 115 lb 6.4 oz (52.345 kg)  11/20/11 118 lb 9.6 oz (53.797 kg)   Constitutional: She is well-developed and well-nourished. No distress. Dtr at side Neck: Normal range of motion. Neck supple. No JVD present. No thyromegaly present.  Cardiovascular: Normal rate, regular rhythm and normal heart sounds.  No murmur heard. No BLE edema. Pulmonary/Chest: Effort normal and breath sounds normal. No respiratory distress. She has no wheezes. Neurological: She is alert and oriented to self but not place, purpose or time. No cranial nerve deficit. Coordination and balance normal. No dysarthria Psychiatric: She has a normal mood and affect. Her behavior is normal. Frequent redirection/reminding   Lab Results  Component Value Date   WBC 3.5* 11/20/2011   HGB 14.1 11/20/2011   HCT 42.4 11/20/2011   PLT 190.0 11/20/2011   CHOL 220* 09/18/2011   TRIG 49.0 09/18/2011   HDL 86.00 09/18/2011   LDLDIRECT 119.9 09/18/2011   ALT 9 07/15/2011   AST 20 07/15/2011   NA 143 11/20/2011   K 5.2* 11/20/2011   CL 106 11/20/2011   CREATININE 0.9 11/20/2011   BUN 14 11/20/2011   CO2 30 11/20/2011   TSH 3.58 11/20/2011  INR 0.97 07/15/2011   HGBA1C  Value: 5.3 (NOTE)                                                                       According to the ADA Clinical Practice Recommendations for 2011, when HbA1c is used as a screening test:   >=6.5%   Diagnostic of Diabetes Mellitus           (if abnormal result  is confirmed)  5.7-6.4%   Increased risk of developing Diabetes Mellitus  References:Diagnosis and Classification of Diabetes Mellitus,Diabetes Care,2011,34(Suppl 1):S62-S69 and Standards of Medical Care in         Diabetes - 2011,Diabetes Care,2011,34  (Suppl 1):S11-S61. 05/11/2010       Assessment & Plan:  See problem list. Medications and labs reviewed today.

## 2012-03-18 NOTE — Assessment & Plan Note (Signed)
Increased aricept 06/2011- Family supportive and involved, attentive to support and comfort rather than aggressive eval

## 2012-03-31 ENCOUNTER — Other Ambulatory Visit: Payer: Self-pay | Admitting: *Deleted

## 2012-03-31 MED ORDER — DONEPEZIL HCL 10 MG PO TABS: 10.0000 mg | ORAL_TABLET | Freq: Every day | ORAL | Status: DC

## 2012-03-31 NOTE — Telephone Encounter (Signed)
R'cd fax from [facility] for refill of Donepezil.

## 2012-04-09 ENCOUNTER — Emergency Department (HOSPITAL_COMMUNITY)
Admission: EM | Admit: 2012-04-09 | Discharge: 2012-04-10 | Disposition: A | Discharge status: Home / Self Care | Payer: 59 | Attending: Emergency Medicine | Admitting: Emergency Medicine

## 2012-04-09 ENCOUNTER — Emergency Department (HOSPITAL_COMMUNITY): Payer: 59

## 2012-04-09 ENCOUNTER — Encounter (HOSPITAL_COMMUNITY): Payer: Self-pay | Admitting: Emergency Medicine

## 2012-04-09 DIAGNOSIS — G309 Alzheimer's disease, unspecified: Secondary | ICD-10-CM | POA: Insufficient documentation

## 2012-04-09 DIAGNOSIS — R109 Unspecified abdominal pain: Secondary | ICD-10-CM | POA: Insufficient documentation

## 2012-04-09 DIAGNOSIS — E7800 Pure hypercholesterolemia, unspecified: Secondary | ICD-10-CM | POA: Insufficient documentation

## 2012-04-09 DIAGNOSIS — Z9861 Coronary angioplasty status: Secondary | ICD-10-CM | POA: Insufficient documentation

## 2012-04-09 DIAGNOSIS — Z7982 Long term (current) use of aspirin: Secondary | ICD-10-CM | POA: Insufficient documentation

## 2012-04-09 DIAGNOSIS — R55 Syncope and collapse: Secondary | ICD-10-CM | POA: Insufficient documentation

## 2012-04-09 DIAGNOSIS — Z860100 Personal history of colon polyps, unspecified: Secondary | ICD-10-CM | POA: Insufficient documentation

## 2012-04-09 DIAGNOSIS — I2510 Atherosclerotic heart disease of native coronary artery without angina pectoris: Secondary | ICD-10-CM | POA: Insufficient documentation

## 2012-04-09 DIAGNOSIS — F411 Generalized anxiety disorder: Secondary | ICD-10-CM | POA: Insufficient documentation

## 2012-04-09 DIAGNOSIS — R1110 Vomiting, unspecified: Secondary | ICD-10-CM | POA: Insufficient documentation

## 2012-04-09 DIAGNOSIS — F0280 Dementia in other diseases classified elsewhere without behavioral disturbance: Secondary | ICD-10-CM | POA: Insufficient documentation

## 2012-04-09 DIAGNOSIS — K589 Irritable bowel syndrome without diarrhea: Secondary | ICD-10-CM | POA: Insufficient documentation

## 2012-04-09 DIAGNOSIS — Z8719 Personal history of other diseases of the digestive system: Secondary | ICD-10-CM | POA: Insufficient documentation

## 2012-04-09 DIAGNOSIS — Z79899 Other long term (current) drug therapy: Secondary | ICD-10-CM | POA: Insufficient documentation

## 2012-04-09 LAB — COMPREHENSIVE METABOLIC PANEL
ALT: 13 U/L (ref 0–35)
AST: 22 U/L (ref 0–37)
Alkaline Phosphatase: 73 U/L (ref 39–117)
CO2: 29 mEq/L (ref 19–32)
Chloride: 102 mEq/L (ref 96–112)
GFR calc non Af Amer: 62 mL/min — ABNORMAL LOW (ref >90)
Potassium: 3.6 mEq/L (ref 3.5–5.1)
Sodium: 140 mEq/L (ref 135–145)
Total Bilirubin: 0.4 mg/dL (ref 0.3–1.2)

## 2012-04-09 LAB — CBC WITH DIFFERENTIAL/PLATELET
Basophils Absolute: 0 10*3/uL (ref 0.0–0.1)
HCT: 40.9 % (ref 36.0–46.0)
Lymphocytes Relative: 25 % (ref 12–46)
Lymphs Abs: 1.3 10*3/uL (ref 0.7–4.0)
Neutro Abs: 3.6 10*3/uL (ref 1.7–7.7)
Platelets: 200 10*3/uL (ref 150–400)
RBC: 4.43 MIL/uL (ref 3.87–5.11)
RDW: 13.7 % (ref 11.5–15.5)
WBC: 5.2 10*3/uL (ref 4.0–10.5)

## 2012-04-09 LAB — LACTIC ACID, PLASMA: Lactic Acid, Venous: 1.4 mmol/L (ref 0.5–2.2)

## 2012-04-09 LAB — URINE MICROSCOPIC-ADD ON

## 2012-04-09 LAB — URINALYSIS, ROUTINE W REFLEX MICROSCOPIC
Glucose, UA: NEGATIVE mg/dL
Leukocytes, UA: NEGATIVE
Protein, ur: 100 mg/dL — AB
Specific Gravity, Urine: 1.012 (ref 1.005–1.030)
Urobilinogen, UA: 0.2 mg/dL (ref 0.0–1.0)

## 2012-04-09 NOTE — ED Notes (Addendum)
Arrived via [facility]. Patient from home with N/V near syncope. Abdominal pain. Ambulatory at scene. Hypertensive with history. Pacemaker. 4mg  zofran given during transport

## 2012-04-09 NOTE — ED Provider Notes (Signed)
History     CSN: [id number]  Arrival date & time 04/09/12  2200   First MD Initiated Contact with Patient 04/09/12 2212      Chief Complaint  Patient presents with  . Near Syncope    (Consider location/radiation/quality/duration/timing/severity/associated sxs/prior treatment) HPI 78 year old female presents via EMS with chief complaint of near syncope. Patient currently complains of mild abdominal pain and the need to urinate. She states that she did not feel well earlier tonight and additional history was obtained from the patient's daughter who is at bedside. She states that about 3 hours ago the patient was at home with her son and developed sudden onset of "almost passing out" she then proceeded to become diaphoretic and vomited several times. She complained of abdominal pain that was worse with emesis. This persisited and EMS was called. EMS gave the patient IV Zofran with resolution of the emesis and nausea. Patient describes this as a severe problem. She denies any fever denies cough or shortness of breath. Denies dysuria.  Past Medical History  Diagnosis Date  . COLONIC POLYPS, ADENOMATOUS   . MITRAL REGURGITATION   . CORONARY, ARTERIOSCLEROSIS 05/1998    PCI BMS to mid LAD 05/1998, repeat cath in 02/07 revealed mild in-stent restenosis, otherwise patent)  . Irritable bowel syndrome   . ALZHEIMERS DISEASE   . ANXIETY   . HYPERCHOLESTEROLEMIA   . HERNIA, HIATAL, NONCONGENITAL   . Abnormal chest CT 04/2010    RUL nodule  . Angina   . H/O hiatal hernia     Past Surgical History  Procedure Date  . Coronary angioplasty with stent placement 06/04/1998    "1"; LAD  . Insert / replace / remove pacemaker 2007    initial placement; [employer]  . Abdominal hysterectomy 1965  . Appendectomy 1965    w/hyster  . Cataract extraction w/ intraocular lens implant     left    Family History  Problem Relation Age of Onset  . Stroke Father   . Heart disease Mother   . Heart  disease Father   . Heart disease Brother   . Heart disease Sister     History  Substance Use Topics  . Smoking status: Never Smoker   . Smokeless tobacco: Current User    Types: Chew  . Alcohol Use: No    OB History    Grav Para Term Preterm Abortions TAB SAB Ect Mult Living                  Review of Systems Constitutional: Negative for fever.  Eyes: Negative for vision loss.  ENT: Negative for difficulty swallowing.  Cardiovascular: Negative for chest pain. Respiratory: Negative for respiratory distress.  Gastrointestinal:  Positive for vomiting.  Genitourinary: Negative for inability to void.  Musculoskeletal: Negative for gait problem.  Integumentary: Negative for rash.  Neurological: Negative for new focal weakness.     Allergies  Diazepam  Home Medications   Current Outpatient Rx  Name  Route  Sig  Dispense  Refill  . ASPIRIN 81 MG PO TBEC   Oral   Take 81 mg by mouth daily.         Marland Kitchen DOCUSATE SODIUM 100 MG PO CAPS   Oral   Take 100 mg by mouth daily.         . DONEPEZIL HCL 10 MG PO TABS   Oral   Take 1 tablet (10 mg total) by mouth at bedtime.   30 tablet   5   .  ONE DAILY FOR WOMEN 50+ ADV PO   Oral   Take 1 tablet by mouth daily.          Marland Kitchen NITROGLYCERIN 0.4 MG SL SUBL   Sublingual   Place 0.4 mg under the tongue every 5 (five) minutes x 3 doses as needed. For chest pain         . PAROXETINE HCL 20 MG PO TABS   Oral   Take 1 tablet (20 mg total) by mouth every morning.   30 tablet   5   . ZOLPIDEM TARTRATE 5 MG PO TABS   Oral   Take 5 mg by mouth at bedtime as needed. For sleep           BP 169/89  Pulse 75  Temp 98.5 F (36.9 C) (Oral)  Resp 18  SpO2 98%  Physical Exam Nursing note and vitals reviewed.  Constitutional: Pt is alert and appears stated age. Eyes: No injection, no scleral icterus. HENT: Atraumatic, airway open without erythema or exudate.  Respiratory: No respiratory distress. Equal breathing  bilaterally. Cardiovascular: Normal rate. Extremities warm and well perfused.  Abdomen: Soft, mild LLQ tenderness. No rebound, no guarding.  MSK: Extremities are atraumatic without deformity. Skin: No rash, no wounds.   Neuro: No motor nor sensory deficit.     ED Course  Procedures (including critical care time)  Labs Reviewed  URINALYSIS, ROUTINE W REFLEX MICROSCOPIC - Abnormal; Notable for the following:    Hgb urine dipstick TRACE (*)     Protein, ur 100 (*)     All other components within normal limits  COMPREHENSIVE METABOLIC PANEL - Abnormal; Notable for the following:    Glucose, Bld 137 (*)     GFR calc non Af Amer 62 (*)     GFR calc Af Amer 72 (*)     All other components within normal limits  CBC WITH DIFFERENTIAL  LIPASE, BLOOD  TROPONIN I  URINE MICROSCOPIC-ADD ON  LACTIC ACID, PLASMA   Dg Abd Acute W/chest  04/09/2012  *RADIOLOGY REPORT*  Clinical Data: Near-syncopal episode  ACUTE ABDOMEN SERIES (ABDOMEN 2 VIEW & CHEST 1 VIEW)  Comparison: 07/15/2011 chest radiograph, 08/07/2009 abdominal series  Findings: Unchanged cardiomediastinal contours with mild prominence and aortic atherosclerosis.  Left chest wall battery pack with unchanged lead tip positions.  Coarse interstitial markings and mild hyperinflation.  Mild lung base scarring and mild apical scarring.  No free intraperitoneal air.  Nonobstructive bowel gas pattern. Diffuse osteopenia.  Leftward curvature of the lumbar spine.  IMPRESSION: Nonobstructive bowel gas pattern.   Original Report Authenticated By: Jearld Lesch, M.D.      1. Pre-syncope   2. Emesis   3. Abdominal pain       MDM  78 y.o. female w/ PMHx of CAD s/p stent in 1990, hiatal hernia, s/p hysterectomy/appy, dementia presents w/ pre-syncope, abdominal pain, emesis. Vital signs with no fever no tachycardia no respiratory distress. Patient called resting comfortably on my exam. Abdomen soft does have some LLQ tenderness but no guarding  or peritoneal signs. EKG without ischemic changes. Bedside limited abdominal US without evidence of large AAA. Plan for w/u.    AAS with nonobstructive bowel gas pattern. CBC unremarkable. Troponin low. Lipase low. CMP unremarkable. Cr <1. UA without signs of infection. Trace blood and protein. On re-eval, pt resting comfortably NAD with unchanged exam. Pt states she feel is feeling well, "about 90% better." Informed patient and daughter who is an NA about  results. Pt daughter asked "if everything looks good is it okay for [place] to go home." Had long discussion with recommendation for admission but given patient and daughter are in agreement that they would not like overly aggressive treatment. Feel it is okay to d/c home given they understand risks. Pt give PO which she was able to tolerate. With lactic acid still pending I called lab and they were able to tell me it resulted at 1.4. Pt d/c'd with appropriate instructions.     Date: 04/09/2012  Rate: 94  Rhythm: normal sinus rhythm  QRS Axis: normal  Intervals: QT prolonged  ST/T Wave abnormalities: nonspecific ST changes  Conduction Disutrbances:none  Narrative Interpretation:   Old EKG Reviewed: none available    I independently viewed, interpreted, and used in my medical decision making all ordered lab and imaging tests. Medical Decision Making discussed with ED attending Gavin Pound. Oletta Lamas, MD          Charm Barges, MD 04/09/12 [id number]

## 2012-04-10 NOTE — ED Notes (Addendum)
Pt ready for discharge per Dr. Oletta Lamas. Family at bedside

## 2012-04-10 NOTE — ED Provider Notes (Signed)
I saw and evaluated the patient, reviewed the resident's note and I agree with the findings and plan.  I reviewed and agree with resident interpretation.  Pt with near syncope, got mildly sweaty, vomited a few times. Mild lower abd tenderness on exam.  Pt however feels much improved. Recommended to pt and family to be admitted for near syncope. Pt and family would like to go home. Work up here is neg, lactic acid is neg, troponin is neg.  No suspicion for AAA, atypical for ACS, no signs or symtpoms for stroke.  Pt and family understand to return if worse, to follow up closely with PCP.    Gavin Pound. Oletta Lamas, MD 04/10/12 1610

## 2012-04-15 ENCOUNTER — Telehealth: Payer: Self-pay | Admitting: Internal Medicine

## 2012-04-15 NOTE — Telephone Encounter (Signed)
Patient Information:  Caller Name: Claris Che  Phone: [phone]  Patient: Anna, [name]  Gender: Female  DOB: [birth year]  Age: 78 Years  PCP: Rene Paci (Adults only)   Symptoms  Reason For Call & Symptoms: Dizzy and tired  Reviewed Health History In EMR: Yes  Reviewed Medications In EMR: Yes  Reviewed Allergies In EMR: Yes  Date of Onset of Symptoms: 04/15/2012  Guideline(s) Used:  Dizziness  Disposition Per Guideline:   See Today in Office  Reason For Disposition Reached:   Patient wants to be seen  Advice Given:  Drink Fluids:  Drink several glasses of fruit juice, other clear fluids, or water. This will improve hydration and blood glucose. If you have a fever or have had heat exposure, make sure the fluids are cold.  Rest for 1-2 Hours:  Lie down with feet elevated for 1 hour. This will improve blood flow and increase blood flow to the brain.  Stand Up Slowly:  Sit down or lie down if you feel dizzy.  Call Back If:  Passes out (faints)  You become worse.  Office Follow Up:  Does the office need to follow up with this patient?: No  Instructions For The Office: N/A  Appointment Scheduled:  04/16/2012 09:15:00  RN Note:  Daughter states parent went to hospital for this on 04/09/12. At that time her blood pressure was elevated. They dont have a way to check blood pressure today. Offered appointment today, declines today because Dr. Felicity Coyer not in this evening. Appointment made for tomorrow.

## 2012-04-16 ENCOUNTER — Encounter: Payer: Self-pay | Admitting: Internal Medicine

## 2012-04-16 ENCOUNTER — Ambulatory Visit (INDEPENDENT_AMBULATORY_CARE_PROVIDER_SITE_OTHER): Payer: 59 | Admitting: Internal Medicine

## 2012-04-16 VITALS — BP 100/60 | HR 77 | Temp 98.0°F | Ht 62.0 in | Wt 116.8 lb

## 2012-04-16 DIAGNOSIS — F0280 Dementia in other diseases classified elsewhere without behavioral disturbance: Secondary | ICD-10-CM

## 2012-04-16 DIAGNOSIS — G309 Alzheimer's disease, unspecified: Secondary | ICD-10-CM

## 2012-04-16 DIAGNOSIS — R42 Dizziness and giddiness: Secondary | ICD-10-CM

## 2012-04-16 DIAGNOSIS — F411 Generalized anxiety disorder: Secondary | ICD-10-CM

## 2012-04-16 MED ORDER — LORAZEPAM 0.5 MG PO TABS: 0.2500 mg | ORAL_TABLET | Freq: Three times a day (TID) | ORAL | Status: DC | PRN

## 2012-04-16 MED ORDER — MECLIZINE HCL 25 MG PO TABS: 12.5000 mg | ORAL_TABLET | Freq: Three times a day (TID) | ORAL | Status: DC | PRN

## 2012-04-16 NOTE — Progress Notes (Signed)
Subjective:    Patient ID: Anna [name], female    DOB: 05-15-1920, 78 y.o.   MRN: [id number]  HPI  Here for dizzy symptoms Seen in ER for same 11/15 - then associated with hypertension urgency and near syncope  Past Medical History  Diagnosis Date  . COLONIC POLYPS, ADENOMATOUS   . MITRAL REGURGITATION   . CORONARY, ARTERIOSCLEROSIS 05/1998    PCI BMS to mid LAD 05/1998, repeat cath in 02/07 revealed mild in-stent restenosis, otherwise patent)  . Irritable bowel syndrome   . ALZHEIMERS DISEASE   . ANXIETY   . HYPERCHOLESTEROLEMIA   . HERNIA, HIATAL, NONCONGENITAL   . Abnormal chest CT 04/2010    RUL nodule  . Angina   . H/O hiatal hernia     Review of Systems  Constitutional: Positive for fatigue. Negative for fever and unexpected weight change.  Respiratory: Positive for shortness of breath. Negative for cough and wheezing.   Cardiovascular: Negative for chest pain, palpitations and leg swelling.  Neurological: Positive for dizziness and light-headedness. Negative for tremors, seizures, syncope, facial asymmetry, speech difficulty, weakness and headaches.  Psychiatric/Behavioral: Negative for behavioral problems. The patient is nervous/anxious.        Objective:   Physical Exam BP 100/60  Pulse 77  Temp 98 F (36.7 C) (Oral)  Ht 5\' 2"  (1.575 m)  Wt 116 lb 12.8 oz (52.98 kg)  BMI 21.36 kg/m2  SpO2 94% Wt Readings from Last 3 Encounters:  04/16/12 116 lb 12.8 oz (52.98 kg)  03/18/12 118 lb (53.524 kg)  12/04/11 115 lb 6.4 oz (52.345 kg)   Constitutional: She appears well-developed and well-nourished. No distress. Dtr at side HENT: Head: Normocephalic and atraumatic. Ears: B TMs ok, no erythema or effusion; Nose: Nose normal. Mouth/Throat: Oropharynx is clear and moist. No oropharyngeal exudate.  Eyes: Conjunctivae and EOM are normal. Pupils are equal, round, and reactive to light. No scleral icterus.  Neck: Normal range of motion. Neck supple. No JVD present. No  thyromegaly present.  Cardiovascular: Normal rate, regular rhythm and normal heart sounds.  No murmur heard. No BLE edema. Pulmonary/Chest: Effort normal and breath sounds normal. No respiratory distress. She has no wheezes.  Neurological: She is alert and oriented to person - but not place or time. No cranial nerve deficit. Coordination normal.    Lab Results  Component Value Date   WBC 5.2 04/09/2012   HGB 13.9 04/09/2012   HCT 40.9 04/09/2012   PLT 200 04/09/2012   GLUCOSE 137* 04/09/2012   CHOL 220* 09/18/2011   TRIG 49.0 09/18/2011   HDL 86.00 09/18/2011   LDLDIRECT 119.9 09/18/2011   LDLCALC  Value: 113        Total Cholesterol/HDL:CHD Risk Coronary Heart Disease Risk Table                     Men   Women  1/2 Average Risk   3.4   3.3  Average Risk       5.0   4.4  2 X Average Risk   9.6   7.1  3 X Average Risk  23.4   11.0        Use the calculated Patient Ratio above and the CHD Risk Table to determine the patient's CHD Risk.        ATP III CLASSIFICATION (LDL):  <100     mg/dL   Optimal  782-956  mg/dL   Near or Above  Optimal  130-159  mg/dL   Borderline  841-324  mg/dL   High  >401     mg/dL   Very High* 02/72/5366   ALT 13 04/09/2012   AST 22 04/09/2012   NA 140 04/09/2012   K 3.6 04/09/2012   CL 102 04/09/2012   CREATININE 0.81 04/09/2012   BUN 18 04/09/2012   CO2 29 04/09/2012   TSH 3.58 11/20/2011   INR 0.97 07/15/2011   HGBA1C  Value: 5.3 (NOTE)                                                                       According to the ADA Clinical Practice Recommendations for 2011, when HbA1c is used as a screening test:   >=6.5%   Diagnostic of Diabetes Mellitus           (if abnormal result  is confirmed)  5.7-6.4%   Increased risk of developing Diabetes Mellitus  References:Diagnosis and Classification of Diabetes Mellitus,Diabetes Care,2011,34(Suppl 1):S62-S69 and Standards of Medical Care in         Diabetes - 2011,Diabetes Care,2011,34  (Suppl 1):S11-S61.  05/11/2010   Dg Abd Acute W/chest  04/09/2012  *RADIOLOGY REPORT*  Clinical Data: Near-syncopal episode  ACUTE ABDOMEN SERIES (ABDOMEN 2 VIEW & CHEST 1 VIEW)  Comparison: 07/15/2011 chest radiograph, 08/07/2009 abdominal series  Findings: Unchanged cardiomediastinal contours with mild prominence and aortic atherosclerosis.  Left chest wall battery pack with unchanged lead tip positions.  Coarse interstitial markings and mild hyperinflation.  Mild lung base scarring and mild apical scarring.  No free intraperitoneal air.  Nonobstructive bowel gas pattern. Diffuse osteopenia.  Leftward curvature of the lumbar spine.  IMPRESSION: Nonobstructive bowel gas pattern.   Original Report Authenticated By: Jearld Lesch, M.D.        Assessment & Plan:   Recurrent dizzy symptoms Vertigo Anxiety Dementia - focus on comfort care per family pref  Cardiac eval appears negative: neg cardiac enz and ECG 04/09/2012, neg holter monitor 11/2011 and normal PPM check 07/2010 Neg labs at ER 06/10/11 HENT exam today neg and neuro exam without change  tx for BPPV with meclizine and/or BZ prn (also overlap with anxiety symptoms) Education provided to dtr on same Time spent with pt/family today 25 minutes, greater than 50% time spent counseling patient on dizzy spells and medication review. Also review of ER records

## 2012-04-16 NOTE — Patient Instructions (Signed)
It was good to see you today. We have reviewed your ER records including labs and tests today Use meclizine OR lorazepam (ativan) 1/2-1 pill 3x/day AS NEEDED or dizzy spells - Your prescription(s) have been submitted to your pharmacy. Please take as directed and contact our office if you believe you are having problem(s) with the medication(s).

## 2012-05-14 ENCOUNTER — Telehealth: Payer: Self-pay | Admitting: *Deleted

## 2012-05-14 MED ORDER — TRAZODONE HCL 50 MG PO TABS: 25.0000 mg | ORAL_TABLET | Freq: Every evening | ORAL | Status: DC | PRN

## 2012-05-14 NOTE — Telephone Encounter (Signed)
Notified pt daughter Claris Che) gave md response...Raechel Chute

## 2012-05-14 NOTE — Telephone Encounter (Signed)
Noted - will change to trazodone as needed for sleep - please let pt know same

## 2012-05-14 NOTE — Telephone Encounter (Signed)
Received fax stating starting January 1st the med Zolpidem 5 mg has a maximim 90 days per calendar year...Raechel Chute

## 2012-06-14 ENCOUNTER — Telehealth: Payer: Self-pay | Admitting: *Deleted

## 2012-06-14 MED ORDER — LORAZEPAM 0.5 MG PO TABS: 0.2500 mg | ORAL_TABLET | Freq: Three times a day (TID) | ORAL | Status: DC | PRN

## 2012-06-14 MED ORDER — ZOLPIDEM TARTRATE 5 MG PO TABS: 5.0000 mg | ORAL_TABLET | Freq: Every evening | ORAL | Status: DC | PRN

## 2012-06-14 NOTE — Telephone Encounter (Signed)
Refills on each done - please send fax

## 2012-06-14 NOTE — Addendum Note (Signed)
Addended by: Rene Paci A on: 06/14/2012 05:17 PM   Modules accepted: Orders

## 2012-06-14 NOTE — Telephone Encounter (Signed)
PHARMACY SENT REFILL REQUESTS FOR ZOLPIDEM AND LORAZEPAM.

## 2012-06-14 NOTE — Telephone Encounter (Signed)
PHARMACY SENT REFILL REQUEST FOR ZOLPIDEM AND LORAZEPAM.

## 2012-06-14 NOTE — Telephone Encounter (Signed)
Pharmacy sent refill request  For zolpidem. Last OV 04/16/2012. [facility] appt. 09/16/2012

## 2012-06-15 NOTE — Telephone Encounter (Signed)
Faxed both rx's back to [facility]...lmb

## 2012-08-02 ENCOUNTER — Other Ambulatory Visit: Payer: Self-pay | Admitting: *Deleted

## 2012-08-02 MED ORDER — LORAZEPAM 0.5 MG PO TABS: 0.2500 mg | ORAL_TABLET | Freq: Three times a day (TID) | ORAL | Status: DC | PRN

## 2012-08-02 NOTE — Telephone Encounter (Signed)
Faxed script bck to [facility]...lmb

## 2012-08-18 ENCOUNTER — Other Ambulatory Visit: Payer: Self-pay | Admitting: *Deleted

## 2012-08-18 MED ORDER — PAROXETINE HCL 20 MG PO TABS: 20.0000 mg | ORAL_TABLET | Freq: Every evening | ORAL | Status: DC

## 2012-09-16 ENCOUNTER — Ambulatory Visit (INDEPENDENT_AMBULATORY_CARE_PROVIDER_SITE_OTHER): Payer: 59 | Admitting: Internal Medicine

## 2012-09-16 ENCOUNTER — Encounter: Payer: Self-pay | Admitting: Internal Medicine

## 2012-09-16 VITALS — BP 110/72 | HR 85 | Temp 97.6°F | Wt 116.0 lb

## 2012-09-16 DIAGNOSIS — K589 Irritable bowel syndrome without diarrhea: Secondary | ICD-10-CM

## 2012-09-16 DIAGNOSIS — G309 Alzheimer's disease, unspecified: Secondary | ICD-10-CM

## 2012-09-16 DIAGNOSIS — F0280 Dementia in other diseases classified elsewhere without behavioral disturbance: Secondary | ICD-10-CM

## 2012-09-16 DIAGNOSIS — F411 Generalized anxiety disorder: Secondary | ICD-10-CM

## 2012-09-16 MED ORDER — MEMANTINE HCL 5 MG PO TABS: 5.0000 mg | ORAL_TABLET | Freq: Two times a day (BID) | ORAL | Status: DC

## 2012-09-16 NOTE — Patient Instructions (Signed)
It was good to see you today. We have reviewed your prior records including labs and tests today Add namenda 5mg  2x/day to ongoing medications - Your prescription(s) have been submitted to your pharmacy. Please take as directed and contact our office if you believe you are having problem(s) with the medication(s). Other Medications reviewed and updated, no additional changes recommended at this time.  Signed medical copies of DO NOT RESUSCITATE form and MOST form provided to you today and copies into medical records -keep with you/patient to show EMS or hospital staff as needed followup in 6 months for medication review, call sooner if problems

## 2012-09-16 NOTE — Assessment & Plan Note (Signed)
Chronic IBS-C - primary concern of pt -  Again advised OTC meds for tx same

## 2012-09-16 NOTE — Assessment & Plan Note (Signed)
started paxil 11/2009, titrate up 02/2012  Added traz for sleep 04/2012 due to formulary change/limitations of #ambein/yr reassurance and redirection provided Support to pt and family

## 2012-09-16 NOTE — Progress Notes (Signed)
Subjective:    Patient ID: Anna [name], female    DOB: 03/21/1920, 79 y.o.   MRN: [id number]  HPI  Here for follow up - reviewed interval hx and events  Past Medical History  Diagnosis Date  . COLONIC POLYPS, ADENOMATOUS   . MITRAL REGURGITATION   . CORONARY, ARTERIOSCLEROSIS 05/1998    PCI BMS to mid LAD 05/1998, repeat cath in 02/07 revealed mild in-stent restenosis, otherwise patent)  . Irritable bowel syndrome   . ALZHEIMERS DISEASE   . ANXIETY   . HYPERCHOLESTEROLEMIA   . HERNIA, HIATAL, NONCONGENITAL   . Abnormal chest CT 04/2010    RUL nodule  . Angina   . H/O hiatal hernia     Review of Systems  Constitutional: Positive for fatigue. Negative for fever and unexpected weight change.  Respiratory: Negative for cough and shortness of breath.   Cardiovascular: Negative for chest pain, palpitations and leg swelling.  Gastrointestinal: Positive for constipation (chronic w/o change).  Neurological: Negative for syncope, speech difficulty and headaches.  Psychiatric/Behavioral: Positive for sleep disturbance. Negative for behavioral problems. The patient is nervous/anxious.        Objective:   Physical Exam  BP 110/72  Pulse 85  Temp(Src) 97.6 F (36.4 C) (Oral)  Wt 116 lb (52.617 kg)  BMI 21.21 kg/m2  SpO2 99% Wt Readings from Last 3 Encounters:  09/16/12 116 lb (52.617 kg)  04/16/12 116 lb 12.8 oz (52.98 kg)  03/18/12 118 lb (53.524 kg)   Constitutional: She appears well-developed and well-nourished. No distress. Dtr at side Cardiovascular: Normal rate, regular rhythm and normal heart sounds.  No murmur heard. No BLE edema. Pulmonary/Chest: Effort normal and breath sounds normal. No respiratory distress. She has no wheezes.  Neurological: She is alert and oriented to person - but not place or time. No cranial nerve deficit. Coordination normal.    Lab Results  Component Value Date   WBC 5.2 04/09/2012   HGB 13.9 04/09/2012   HCT 40.9 04/09/2012   PLT  200 04/09/2012   GLUCOSE 137* 04/09/2012   CHOL 220* 09/18/2011   TRIG 49.0 09/18/2011   HDL 86.00 09/18/2011   LDLDIRECT 119.9 09/18/2011   LDLCALC  Value: 113        Total Cholesterol/HDL:CHD Risk Coronary Heart Disease Risk Table                     Men   Women  1/2 Average Risk   3.4   3.3  Average Risk       5.0   4.4  2 X Average Risk   9.6   7.1  3 X Average Risk  23.4   11.0        Use the calculated Patient Ratio above and the CHD Risk Table to determine the patient's CHD Risk.        ATP III CLASSIFICATION (LDL):  <100     mg/dL   Optimal  562-130  mg/dL   Near or Above                    Optimal  130-159  mg/dL   Borderline  865-784  mg/dL   High  >696     mg/dL   Very High* 29/52/8413   ALT 13 04/09/2012   AST 22 04/09/2012   NA 140 04/09/2012   K 3.6 04/09/2012   CL 102 04/09/2012   CREATININE 0.81 04/09/2012   BUN 18 04/09/2012  CO2 29 04/09/2012   TSH 3.58 11/20/2011   INR 0.97 07/15/2011   HGBA1C  Value: 5.3 (NOTE)                                                                       According to the ADA Clinical Practice Recommendations for 2011, when HbA1c is used as a screening test:   >=6.5%   Diagnostic of Diabetes Mellitus           (if abnormal result  is confirmed)  5.7-6.4%   Increased risk of developing Diabetes Mellitus  References:Diagnosis and Classification of Diabetes Mellitus,Diabetes Care,2011,34(Suppl 1):S62-S69 and Standards of Medical Care in         Diabetes - 2011,Diabetes Care,2011,34  (Suppl 1):S11-S61. 05/11/2010        Assessment & Plan:   See problem list. Medications and labs reviewed today.

## 2012-09-16 NOTE — Assessment & Plan Note (Addendum)
Increased aricept to max dose 06/2011- Add namenda due to progressive dz - we reviewed potential risk/benefit and possible side effects - pt understands and agrees to same  Family supportive and involved, attentive to support and comfort rather than aggressive eval Signed and provided dtr with MOST form to clarify DNR status as requested - order placed for same

## 2012-10-19 ENCOUNTER — Other Ambulatory Visit: Payer: Self-pay | Admitting: *Deleted

## 2012-10-19 MED ORDER — ZOLPIDEM TARTRATE 5 MG PO TABS: 5.0000 mg | ORAL_TABLET | Freq: Every evening | ORAL | Status: DC | PRN

## 2012-10-19 MED ORDER — LORAZEPAM 0.5 MG PO TABS: 0.2500 mg | ORAL_TABLET | Freq: Three times a day (TID) | ORAL | Status: DC | PRN

## 2012-10-19 NOTE — Telephone Encounter (Signed)
Faxed script back to [facility].../lmb 

## 2012-11-30 ENCOUNTER — Telehealth: Payer: Self-pay | Admitting: *Deleted

## 2012-11-30 ENCOUNTER — Ambulatory Visit (INDEPENDENT_AMBULATORY_CARE_PROVIDER_SITE_OTHER): Payer: 59 | Admitting: Internal Medicine

## 2012-11-30 ENCOUNTER — Encounter: Payer: Self-pay | Admitting: Internal Medicine

## 2012-11-30 ENCOUNTER — Other Ambulatory Visit (INDEPENDENT_AMBULATORY_CARE_PROVIDER_SITE_OTHER): Payer: 59

## 2012-11-30 VITALS — BP 120/62 | HR 84 | Temp 98.5°F | Wt 117.0 lb

## 2012-11-30 DIAGNOSIS — R002 Palpitations: Secondary | ICD-10-CM

## 2012-11-30 DIAGNOSIS — R5381 Other malaise: Secondary | ICD-10-CM

## 2012-11-30 DIAGNOSIS — E162 Hypoglycemia, unspecified: Secondary | ICD-10-CM

## 2012-11-30 DIAGNOSIS — F0280 Dementia in other diseases classified elsewhere without behavioral disturbance: Secondary | ICD-10-CM

## 2012-11-30 DIAGNOSIS — G309 Alzheimer's disease, unspecified: Secondary | ICD-10-CM

## 2012-11-30 DIAGNOSIS — R5383 Other fatigue: Secondary | ICD-10-CM

## 2012-11-30 DIAGNOSIS — R55 Syncope and collapse: Secondary | ICD-10-CM

## 2012-11-30 LAB — BASIC METABOLIC PANEL
Chloride: 106 mEq/L (ref 96–112)
Creatinine, Ser: 1 mg/dL (ref 0.4–1.2)
Potassium: 3.3 mEq/L — ABNORMAL LOW (ref 3.5–5.1)
Sodium: 142 mEq/L (ref 135–145)

## 2012-11-30 LAB — TSH: TSH: 4.68 u[IU]/mL (ref 0.35–5.50)

## 2012-11-30 LAB — CBC WITH DIFFERENTIAL/PLATELET
Basophils Relative: 0.4 % (ref 0.0–3.0)
Eosinophils Relative: 1.7 % (ref 0.0–5.0)
MCV: 93.8 fl (ref 78.0–100.0)
Monocytes Absolute: 0.3 10*3/uL (ref 0.1–1.0)
Neutrophils Relative %: 57.6 % (ref 43.0–77.0)
RBC: 4.47 Mil/uL (ref 3.87–5.11)
WBC: 4 10*3/uL — ABNORMAL LOW (ref 4.5–10.5)

## 2012-11-30 NOTE — Telephone Encounter (Signed)
Critical Glucose 39...lmb

## 2012-11-30 NOTE — Assessment & Plan Note (Signed)
Increased aricept to max dose 06/2011- Add namenda due to progressive dz - we reviewed potential risk/benefit and possible side effects - pt understands and agrees to same  Family supportive and involved, attentive to support and comfort rather than aggressive eval Signed and provided dtr with MOST form to clarify DNR status as requested in 08/2012- order placed for same 08/2012

## 2012-11-30 NOTE — Progress Notes (Signed)
Subjective:    Patient ID: Anna [name], female    DOB: 10/14/1919, 79 y.o.   MRN: [id number]  Hypertension Associated symptoms include palpitations and shortness of breath. Pertinent negatives include no chest pain or headaches.   Also here for follow up - reviewed interval hx and events See PA student note for other details  Past Medical History  Diagnosis Date  . COLONIC POLYPS, ADENOMATOUS   . MITRAL REGURGITATION   . CORONARY, ARTERIOSCLEROSIS 05/1998    PCI BMS to mid LAD 05/1998, repeat cath in 02/07 revealed mild in-stent restenosis, otherwise patent)  . Irritable bowel syndrome   . ALZHEIMERS DISEASE   . ANXIETY   . HYPERCHOLESTEROLEMIA   . HERNIA, HIATAL, NONCONGENITAL   . Abnormal chest CT 04/2010    RUL nodule  . Angina   . H/O hiatal hernia     Review of Systems  Constitutional: Positive for fatigue. Negative for fever and unexpected weight change.  Respiratory: Positive for shortness of breath. Negative for cough.   Cardiovascular: Positive for palpitations. Negative for chest pain and leg swelling.  Gastrointestinal: Positive for constipation (chronic w/o change).  Neurological: Negative for syncope, speech difficulty and headaches.  Psychiatric/Behavioral: Positive for sleep disturbance. Negative for behavioral problems. The patient is nervous/anxious.        Objective:   Physical Exam  BP 120/62  Pulse 84  Temp(Src) 98.5 F (36.9 C) (Oral)  Wt 117 lb (53.071 kg)  BMI 21.39 kg/m2  SpO2 96% Wt Readings from Last 3 Encounters:  11/30/12 117 lb (53.071 kg)  09/16/12 116 lb (52.617 kg)  04/16/12 116 lb 12.8 oz (52.98 kg)   Constitutional: She appears well-developed and well-nourished. No distress. Dtr at side Cardiovascular: Normal rate, regular rhythm and normal heart sounds.  No murmur heard. No BLE edema. Pulmonary/Chest: Effort normal and breath sounds normal. No respiratory distress. She has no wheezes.  Neurological: She is alert and oriented  to person - but not place or time. No cranial nerve deficit. Coordination normal.    Lab Results  Component Value Date   WBC 5.2 04/09/2012   HGB 13.9 04/09/2012   HCT 40.9 04/09/2012   PLT 200 04/09/2012   GLUCOSE 137* 04/09/2012   CHOL 220* 09/18/2011   TRIG 49.0 09/18/2011   HDL 86.00 09/18/2011   LDLDIRECT 119.9 09/18/2011   LDLCALC  Value: 113        Total Cholesterol/HDL:CHD Risk Coronary Heart Disease Risk Table                     Men   Women  1/2 Average Risk   3.4   3.3  Average Risk       5.0   4.4  2 X Average Risk   9.6   7.1  3 X Average Risk  23.4   11.0        Use the calculated Patient Ratio above and the CHD Risk Table to determine the patient's CHD Risk.        ATP III CLASSIFICATION (LDL):  <100     mg/dL   Optimal  782-956  mg/dL   Near or Above                    Optimal  130-159  mg/dL   Borderline  213-086  mg/dL   High  >578     mg/dL   Very High* 46/96/2952   ALT 13 04/09/2012   AST 22  04/09/2012   NA 140 04/09/2012   K 3.6 04/09/2012   CL 102 04/09/2012   CREATININE 0.81 04/09/2012   BUN 18 04/09/2012   CO2 29 04/09/2012   TSH 3.58 11/20/2011   INR 0.97 07/15/2011   HGBA1C  Value: 5.3 (NOTE)                                                                       According to the ADA Clinical Practice Recommendations for 2011, when HbA1c is used as a screening test:   >=6.5%   Diagnostic of Diabetes Mellitus           (if abnormal result  is confirmed)  5.7-6.4%   Increased risk of developing Diabetes Mellitus  References:Diagnosis and Classification of Diabetes Mellitus,Diabetes Care,2011,34(Suppl 1):S62-S69 and Standards of Medical Care in         Diabetes - 2011,Diabetes Care,2011,34  (Suppl 1):S11-S61. 05/11/2010        Assessment & Plan:   Fatigue - nonspecific symptoms/exam - check screening labs  symptomatic palpitations and dizziness/near syncope - no syncope or falls reported - chronic symptoms, exacerbated by anxiety and dementia - As HD stable, exam  benign and no chest pain or syncope, will forego aggressive workup in favor of conservative care  See problem list. Medications and labs reviewed today.

## 2012-11-30 NOTE — Patient Instructions (Addendum)
It was good to see you today. We have reviewed your prior records including labs and tests today Test(s) ordered today. Your results will be released to MyChart (or called to you) after review, usually within 72hours after test completion. If any changes need to be made, you will be notified at that same time. Medications reviewed and updated, no changes recommended at this time. Drink extra glass of water everyday to help dizzy symptoms and constipation Please schedule followup in 6 months, call sooner if problems.  Dizziness  Dizziness means you feel unsteady or lightheaded. You might feel like you are going to pass out (faint). HOME CARE   Drink enough fluids to keep your pee (urine) clear or pale yellow.  Take your medicines exactly as told by your doctor. If you take blood pressure medicine, always stand up slowly from the lying or sitting position. Hold on to something to steady yourself.  If you need to stand in one place for a long time, move your legs often. Tighten and relax your leg muscles.  Have someone stay with you until you feel okay.  Do not drive or use heavy machinery if you feel dizzy.  Do not drink alcohol. GET HELP RIGHT AWAY IF:   You feel dizzy or lightheaded and it gets worse.  You feel sick to your stomach (nauseous), or you throw up (vomit).  You have trouble talking or walking.  You feel weak or have trouble using your arms, hands, or legs.  You cannot think clearly or have trouble forming sentences.  You have chest pain, belly (abdominal) pain, sweating, or you are short of breath.  Your vision changes.  You are bleeding.  You have problems from your medicine that seem to be getting worse. MAKE SURE YOU:   Understand these instructions.  Will watch your condition.  Will get help right away if you are not doing well or get worse. Document Released: 05/01/2011 Document Revised: 08/04/2011 Document Reviewed: 05/01/2011 [facility] Patient  Information 2014 [place], [place].

## 2012-11-30 NOTE — Progress Notes (Signed)
Subjective:    Patient ID: Anna [name], female    DOB: 24-Dec-1919, 79 y.o.   MRN: [id number]  HPI Patient presents with fatigue, and dyspnea on exertion x several months. Patient has begun to feel easily fatigued with ADL's which is a change from her baseline. Daughter has been monitoring vitals because of the patients symptoms and has noted a rise in afternoon BP. Patient reportedly normally runs a systolic of 120 or under and has been averaging mid 140's over the last several months when feeling fatigued. Notes that her heart rate has been running normal, and her O2 sats have remained above 96%. Reports mild syncope during these episodes. Denies vertigo, diaphoresis, cyanosis, peripheral edema, headaches or blurred vision.  Denies any recent changes to medications, lifestyle changes to afternoon routine, illness, ill contacts, or recent travel.  Patient has already been evaluated by cardiology, who were not able to find a cause for the symptoms   Review of Systems General: Reports fatigue, denies nausea, vomiting, malaise Eyes: recent discovery of partially detached retina, managed by optho. Denies blurred vision, scotomata ENT: Denies cough Cardiac: Reports episodes of racing heart rate. Denies chest pain, chest tightness, palpitations Pulmonary: Reports DOE with some mild SOB GI: Denies changes in bowel or bladder habits, melena, hematochezia, hematuria Neuro: Positive for occasional anxiety. Daughter states no recent changes in mental status. Denies paresthesias, headaches, focal motor weakness. MSK: Positive for cool extremities. Denies focal joint or muscle weakness or pain    Objective:   Physical Exam BP 120/62  Pulse 84  Temp(Src) 98.5 F (36.9 C) (Oral)  Wt 117 lb (53.071 kg)  BMI 21.39 kg/m2  SpO2 96% Wt Readings from Last 3 Encounters:  11/30/12 117 lb (53.071 kg)  09/16/12 116 lb (52.617 kg)  04/16/12 116 lb 12.8 oz (52.98 kg)    HEENT Cardiac: Pacemaker noted,  RRR, no MGR, +2 radial pulses bilaterally with normal capillary refill. +2 popiliteal pulses Pulmonary: Non labored breathing, no accessory muscle use or cyanosis noted, breath sounds clear bilaterally Neuro: No focal motor or sensory deficits, PEERLA MSK: Mild coolness in hands, however no edema or cyanosis, no peripheral edema. No noted focal weakness, no edema       Assessment & Plan:  Due to syncope, will draw blood work today including a CBC, BMET and TSH to rule out any underlying pathological reason.   Cardiac etiology ruled out with a stable BP in office today, vitals taken by daughter consistently in the acceptable ranges, and a normal physical exam. Patient sees cardiology regularly for a Pacemaker, and no underlying problem has been noted in her last several visits. Hesitant to add an additional medication like a beta blocker for symptomatic tachycardia because of her age and currently stable BP. Will not add any additional medications for concern about dropping BP too low and causing a syncopal episode. Will wait to see is symptoms decrease with increased fluid intake and reevaluate at next follow up.   Neurologically unchanged since last appointment. Oriented x 3, however repetitive and forgetful. No sensory or motor deficits and lack of other neurological symptoms with a normal physical exam rule out a neurological reason for the episodes.   79 year old patient with alzheimer's in advanced stage with recurrent similar symptoms presents with anxious family. Seeking comfort measures that she is doing well, and they are supporting her to the best of their ability. I believe the symptoms to be related to anxiety. Patient and family reassured today.  Encouraged to stay hydrated throughout the summer as that could be a reason for her symptoms as well.    Leandra Kern PA-S

## 2012-11-30 NOTE — Telephone Encounter (Signed)
Called pt daughter Claris Che) no answer LMOM RTC...lmb

## 2012-11-30 NOTE — Telephone Encounter (Signed)
Daughter return call back gave her md response...Anna [name]

## 2012-11-30 NOTE — Telephone Encounter (Signed)
Please call pt dtr - let her know labs show very low blood sugar - this could certainly be causing pt's symptoms of weakness and dizziness - It is not clear to me WHY sugar was so low - none of her medications should be causing this side effect - I will make referral to endo to evaluate this problem - [facility] will call re: appt In meanwhile, please provide frequent snacks - every 2-3 hours thanks

## 2012-12-08 ENCOUNTER — Other Ambulatory Visit: Payer: Self-pay | Admitting: *Deleted

## 2012-12-08 MED ORDER — DONEPEZIL HCL 10 MG PO TABS: 10.0000 mg | ORAL_TABLET | Freq: Every day | ORAL | Status: DC

## 2012-12-15 ENCOUNTER — Ambulatory Visit (INDEPENDENT_AMBULATORY_CARE_PROVIDER_SITE_OTHER): Payer: 59 | Admitting: Endocrinology

## 2012-12-15 ENCOUNTER — Encounter: Payer: Self-pay | Admitting: Endocrinology

## 2012-12-15 VITALS — BP 115/60 | HR 73 | Temp 97.5°F | Resp 12 | Ht 64.0 in | Wt 118.5 lb

## 2012-12-15 DIAGNOSIS — E876 Hypokalemia: Secondary | ICD-10-CM

## 2012-12-15 DIAGNOSIS — R42 Dizziness and giddiness: Secondary | ICD-10-CM

## 2012-12-15 DIAGNOSIS — E162 Hypoglycemia, unspecified: Secondary | ICD-10-CM

## 2012-12-15 LAB — COMPREHENSIVE METABOLIC PANEL
BUN: 17 mg/dL (ref 6–23)
CO2: 29 mEq/L (ref 19–32)
Creatinine, Ser: 1.1 mg/dL (ref 0.4–1.2)
GFR: 50.56 mL/min — ABNORMAL LOW (ref >60.00)
Glucose, Bld: 77 mg/dL (ref 70–99)
Sodium: 142 mEq/L (ref 135–145)
Total Bilirubin: 0.6 mg/dL (ref 0.3–1.2)
Total Protein: 6.2 g/dL (ref 6.0–8.3)

## 2012-12-15 LAB — T4, FREE: Free T4: 0.8 ng/dL (ref 0.60–1.60)

## 2012-12-15 NOTE — Progress Notes (Signed)
Patient ID: Anna [name], female   DOB: [birth year], 79 y.o.   MRN: [id number]  CONSULTATION note:  History of Present Illness:  The patient was referred here because of her lab glucose of 39 Patient's history is given by her daughter mostly The patient has been complaining of feeling tired for the last 6-8 weeks  Also she has been having episodes of passing out for a few seconds for the last 6 weeks The patient appears to have minimal warning symptoms before she passes out and apparently she is somewhat unresponsive for about 10 seconds She may get a little shakiness before she has her passing out episode and looks pale when she is unresponsive but has no sweating no complaints of palpitations The patient recovers from these episodes without any specific treatment or food or drink  Also the patient has had problems with feeling off balance, swimmy headed periodically which occurs almost daily. She may get some shakiness at this time also These symptoms may last for up to an hour. Her symptoms get better after sitting down for about 5 minutes Occasionally she may have her passing out episodes after feeling off-balance including when she had finished getting her lab drawn on her last visit  She has not had any other documented low glucose levels and no previous history of diabetes.  Past Medical History  Diagnosis Date  . COLONIC POLYPS, ADENOMATOUS   . MITRAL REGURGITATION   . CORONARY, ARTERIOSCLEROSIS 05/1998    PCI BMS to mid LAD 05/1998, repeat cath in 02/07 revealed mild in-stent restenosis, otherwise patent)  . Irritable bowel syndrome   . ALZHEIMERS DISEASE   . ANXIETY   . HYPERCHOLESTEROLEMIA   . HERNIA, HIATAL, NONCONGENITAL   . Abnormal chest CT 04/2010    RUL nodule  . Angina   . H/O hiatal hernia     Past Surgical History  Procedure Laterality Date  . Coronary angioplasty with stent placement  06/04/1998    "1"; LAD  . Insert / replace / remove pacemaker  2007     initial placement; [employer]  . Abdominal hysterectomy  1965  . Appendectomy  1965    w/hyster  . Cataract extraction w/ intraocular lens implant      left    Family History  Problem Relation Age of Onset  . Stroke Father   . Heart disease Mother   . Heart disease Father   . Heart disease Brother   . Heart disease Sister     Social History:  reports that she has never smoked. Her smokeless tobacco use includes Chew. She reports that she does not drink alcohol or use illicit drugs.  Allergies:  Allergies  Allergen Reactions  . Diazepam Other (See Comments)    "slows her respirations and her blood pressure bottoms"      Medication List       This list is accurate as of: 12/15/12  1:37 PM.  Always use your most recent med list.               aspirin 81 MG EC tablet  Take 81 mg by mouth every evening.     docusate sodium 100 MG capsule  Commonly known as:  COLACE  Take 100 mg by mouth every evening.     donepezil 10 MG tablet  Commonly known as:  ARICEPT  Take 1 tablet (10 mg total) by mouth at bedtime.     LORazepam 0.5 MG tablet  Commonly known as:  ATIVAN  Take 0.5-1 tablets (0.25-0.5 mg total) by mouth every 8 (eight) hours as needed for anxiety (or dizziness).     meclizine 25 MG tablet  Commonly known as:  ANTIVERT  Take 0.5-1 tablets (12.5-25 mg total) by mouth 3 (three) times daily as needed for dizziness or nausea.     memantine 5 MG tablet  Commonly known as:  NAMENDA  Take 1 tablet (5 mg total) by mouth 2 (two) times daily.     multivitamin with minerals Tabs  Take 1 tablet by mouth every evening. One Daily for Women 50+ Adv Po     nitroGLYCERIN 0.4 MG SL tablet  Commonly known as:  NITROSTAT  Place 0.4 mg under the tongue every 5 (five) minutes x 3 doses as needed. For chest pain     PARoxetine 20 MG tablet  Commonly known as:  PAXIL  Take 1 tablet (20 mg total) by mouth every evening.     traZODone 50 MG tablet  Commonly known as:   DESYREL  Take 0.5-1 tablets (25-50 mg total) by mouth at bedtime as needed for sleep.     zolpidem 5 MG tablet  Commonly known as:  AMBIEN  Take 1 tablet (5 mg total) by mouth at bedtime as needed for sleep.        REVIEW OF SYSTEMS:           No unusual headaches.     No history of weight loss or decreased appetite     Thyroid: She does complain of feeling cold at times and tiredness for the last few weeks     His blood pressure has been normal.     No swelling of feet.     No shortness of breath on exertion.       Bowel habits:  She complains of constipation.          Memory disturbance: Present for the last  3years  She does complain of her feet burning and tingling     She has a long-standing history of depression   She has been taking  trazadone for insomnia      She has no complaints of joint or back pain   EXAM:   BP 122/60  Pulse 73  Temp(Src) 97.5 F (36.4 C)  Resp 12  Ht 5\' 4"  (1.626 m)  Wt 118 lb 8 oz (53.751 kg)  BMI 20.33 kg/m2  SpO2 98%  GENERAL: She is averagely built and nourished, pleasant and cooperative  She appears somewhat pale. No clubbing, lymphadenopathy or edema.  Skin:  no rash or pigmentation.  EYES:  Externally normal.  Fundii: Not clearly visualized on the right, left disc appears pale  ENT:  Mouth & throat normal. Oral mucosa is slightly dry  THYROID:  Not palpable.  CAROTIDS:  Normal character; no bruit.  HEART:  Normal apex, S1 and S2; ejection systolic murmur  3/6 at the apex radiating to the rest of the precordium, no S3 or S4  CHEST:   Lungs:  Vescicular breath sounds heard equally.  No crepitations/ wheeze.  ABDOMEN:  No distention.  Liver and spleen not palpable.  No other mass or tenderness.  RECTAL exam: Not indicated  NEUROLOGICAL: Motor power grossly normal. Deep tendon reflexes: Difficult to elicit on the right biceps and  Brisk left.  Romberg sign is negative.      SPINE AND JOINTS:   Normal.   ASSESSMENT:   Episodes of dizziness and syncope of unclear  etiology She appears to be having episodes of gait imbalance when she has dizziness Also her syncope is unexplained and is not accompanied by any adrenergic symptoms or postictal symptoms No evidence of orthostatic hypotension on today's exam Patient has had one episode which was accompanied by a glucose of 39 in the lab However appears that the patient usually recovers spontaneously from these episodes without any specific treatment and does not have the usual symptoms of hypoglycemia   PLAN:   The patient's daughter is a Engineer, civil (consulting) and will be able to try and measure her glucose by fingerstick if she has another episode. Explained to her how to do this She will also treat symptoms appropriately with 4-6 ounces of a sweet drink  Will rule out secondary causes of hypoglycemia related to hypopituitarism and check free T4, IGF-1 and cortisol levels Consider checking insulin levels and C-peptide on the day of the episode if the patient has documented hypoglycemia  Solange Emry 12/15/2012, 1:37 PM   Addendum: Labs normal  Office Visit on 12/15/2012  Component Date Value Range Status  . Sodium 12/15/2012 142  135 - 145 mEq/L Final  . Potassium 12/15/2012 3.7  3.5 - 5.1 mEq/L Final  . Chloride 12/15/2012 108  96 - 112 mEq/L Final  . CO2 12/15/2012 29  19 - 32 mEq/L Final  . Glucose, Bld 12/15/2012 77  70 - 99 mg/dL Final  . BUN 16/02/9603 17  6 - 23 mg/dL Final  . Creatinine, Ser 12/15/2012 1.1  0.4 - 1.2 mg/dL Final  . Total Bilirubin 12/15/2012 0.6  0.3 - 1.2 mg/dL Final  . Alkaline Phosphatase 12/15/2012 53  39 - 117 U/L Final  . AST 12/15/2012 21  0 - 37 U/L Final  . ALT 12/15/2012 16  0 - 35 U/L Final  . Total Protein 12/15/2012 6.2  6.0 - 8.3 g/dL Final  . Albumin 54/01/8118 3.7  3.5 - 5.2 g/dL Final  . Calcium 14/78/2956 8.8  8.4 - 10.5 mg/dL Final  . GFR 21/30/8657 50.56* >60.00 mL/min Final  . Free T4 12/15/2012  0.80  0.60 - 1.60 ng/dL Final  . Somatomedin (IGF-I) 12/15/2012 116  31 - 179 ng/mL Final  . Cortisol, Plasma 12/15/2012 9.2   Final   AM:  4.3 - 22.4 ug/dLPM:  3.1 - 16.7 ug/dL

## 2012-12-15 NOTE — Patient Instructions (Addendum)
To be decided based on labs  Check sugar when having passing out spell  Avoid Trazadone

## 2012-12-16 LAB — INSULIN-LIKE GROWTH FACTOR: Somatomedin (IGF-I): 116 ng/mL (ref 31–179)

## 2012-12-23 ENCOUNTER — Telehealth: Payer: Self-pay | Admitting: *Deleted

## 2012-12-23 NOTE — Telephone Encounter (Signed)
Message copied by Hermenia Bers on Thu Dec 23, 2012  1:53 PM ------      Message from: Reather Littler      Created: Mon Dec 20, 2012  1:14 PM       All the tests are normal, please find out from her daughter if she has had any blood sugars below 60 ------

## 2012-12-23 NOTE — Telephone Encounter (Signed)
Pt's daughter notified, daughter states she is still having the dizzy spells, she said her lowest blood sugars have been 109 and the highest was 383. Daughters phone # is [phone]

## 2012-12-23 NOTE — Telephone Encounter (Signed)
Left message on mobile number that was listed, home number has been disconnected.

## 2012-12-23 NOTE — Telephone Encounter (Signed)
Message copied by Hermenia Bers on Thu Dec 23, 2012  3:12 PM ------      Message from: Reather Littler      Created: Mon Dec 20, 2012  1:14 PM       All the tests are normal, please find out from her daughter if she has had any blood sugars below 60 ------

## 2012-12-23 NOTE — Telephone Encounter (Signed)
If she has not had any low sugars associated with the dizzy spells that she should go back to see her PCP as soon as possible

## 2013-01-04 ENCOUNTER — Other Ambulatory Visit: Payer: Self-pay | Admitting: *Deleted

## 2013-01-04 MED ORDER — LORAZEPAM 0.5 MG PO TABS: 0.2500 mg | ORAL_TABLET | Freq: Three times a day (TID) | ORAL | Status: DC | PRN

## 2013-01-04 NOTE — Telephone Encounter (Signed)
Faxed script back to [facility].../lmb 

## 2013-01-07 ENCOUNTER — Ambulatory Visit: Payer: 59 | Admitting: Endocrinology

## 2013-01-12 ENCOUNTER — Encounter: Payer: Self-pay | Admitting: Internal Medicine

## 2013-01-12 ENCOUNTER — Ambulatory Visit (INDEPENDENT_AMBULATORY_CARE_PROVIDER_SITE_OTHER): Payer: 59 | Admitting: Internal Medicine

## 2013-01-12 VITALS — BP 120/60 | HR 87 | Temp 98.2°F | Wt 119.8 lb

## 2013-01-12 DIAGNOSIS — F411 Generalized anxiety disorder: Secondary | ICD-10-CM

## 2013-01-12 DIAGNOSIS — E162 Hypoglycemia, unspecified: Secondary | ICD-10-CM

## 2013-01-12 DIAGNOSIS — R627 Adult failure to thrive: Secondary | ICD-10-CM

## 2013-01-12 DIAGNOSIS — F0280 Dementia in other diseases classified elsewhere without behavioral disturbance: Secondary | ICD-10-CM

## 2013-01-12 NOTE — Patient Instructions (Signed)
It was good to see you today. We have reviewed your prior records including labs and tests today Test(s) ordered today. Your results will be released to MyChart (or called to you) after review, usually within 72hours after test completion. If any changes need to be made, you will be notified at that same time. Medications reviewed and updated, no changes recommended at this time. Drink extra glass of water everyday to help dizzy symptoms and constipation Please schedule followup in 6 months, call sooner if problems.  Look into [facility] for daytime activity and socialization as discussed  Dizziness  Dizziness means you feel unsteady or lightheaded. You might feel like you are going to pass out (faint). HOME CARE   Drink enough fluids to keep your pee (urine) clear or pale yellow.  Take your medicines exactly as told by your doctor. If you take blood pressure medicine, always stand up slowly from the lying or sitting position. Hold on to something to steady yourself.  If you need to stand in one place for a long time, move your legs often. Tighten and relax your leg muscles.  Have someone stay with you until you feel okay.  Do not drive or use heavy machinery if you feel dizzy.  Do not drink alcohol. GET HELP RIGHT AWAY IF:   You feel dizzy or lightheaded and it gets worse.  You feel sick to your stomach (nauseous), or you throw up (vomit).  You have trouble talking or walking.  You feel weak or have trouble using your arms, hands, or legs.  You cannot think clearly or have trouble forming sentences.  You have chest pain, belly (abdominal) pain, sweating, or you are short of breath.  Your vision changes.  You are bleeding.  You have problems from your medicine that seem to be getting worse. MAKE SURE YOU:   Understand these instructions.  Will watch your condition.  Will get help right away if you are not doing well or get worse. Document Released:  05/01/2011 Document Revised: 08/04/2011 Document Reviewed: 05/01/2011 [facility] Patient Information 2014 [place], [place].

## 2013-01-12 NOTE — Assessment & Plan Note (Signed)
Increased aricept to max dose 06/2011- Add namenda due to progressive dz - we reviewed potential risk/benefit and possible side effects - pt understands and agrees to same  Family supportive and involved, attentive to support and comfort rather than aggressive eval Signed and provided dtr with MOST form to clarify DNR status as requested in 08/2012- order placed for same 08/2012 Consider [facility] for assistance with care

## 2013-01-12 NOTE — Progress Notes (Signed)
Subjective:    Patient ID: Anna [name], female    DOB: [birth year], 79 y.o.   MRN: [id number]  HPI  here for follow up - reviewed interval hx and events   Past Medical History  Diagnosis Date  . COLONIC POLYPS, ADENOMATOUS   . MITRAL REGURGITATION   . CORONARY, ARTERIOSCLEROSIS 05/1998    PCI BMS to mid LAD 05/1998, repeat cath in 02/07 revealed mild in-stent restenosis, otherwise patent)  . Irritable bowel syndrome   . ALZHEIMERS DISEASE   . ANXIETY   . HYPERCHOLESTEROLEMIA   . HERNIA, HIATAL, NONCONGENITAL   . Abnormal chest CT 04/2010    RUL nodule  . Angina   . H/O hiatal hernia     Review of Systems  Constitutional: Positive for fatigue. Negative for fever and unexpected weight change.  Neurological: Negative for syncope and speech difficulty.  Psychiatric/Behavioral: Positive for sleep disturbance. The patient is nervous/anxious.        Objective:   Physical Exam  BP 120/60  Pulse 87  Temp(Src) 98.2 F (36.8 C) (Oral)  Wt 119 lb 12.8 oz (54.341 kg)  BMI 20.55 kg/m2  SpO2 96% Wt Readings from Last 3 Encounters:  01/12/13 119 lb 12.8 oz (54.341 kg)  12/15/12 118 lb 8 oz (53.751 kg)  11/30/12 117 lb (53.071 kg)   Constitutional: She appears well-developed and well-nourished. No distress. Dtr at side Cardiovascular: Normal rate, regular rhythm and normal heart sounds.  No murmur heard. No BLE edema. Pulmonary/Chest: Effort normal and breath sounds normal. No respiratory distress. She has no wheezes.  Neurological: She is alert and oriented to person - but not place or time. No cranial nerve deficit. Coordination normal.    Lab Results  Component Value Date   WBC 4.0* 11/30/2012   HGB 14.0 11/30/2012   HCT 42.0 11/30/2012   PLT 205.0 11/30/2012   GLUCOSE 77 12/15/2012   CHOL 220* 09/18/2011   TRIG 49.0 09/18/2011   HDL 86.00 09/18/2011   LDLDIRECT 119.9 09/18/2011   LDLCALC  Value: 113        Total Cholesterol/HDL:CHD Risk Coronary Heart Disease Risk Table                      Men   Women  1/2 Average Risk   3.4   3.3  Average Risk       5.0   4.4  2 X Average Risk   9.6   7.1  3 X Average Risk  23.4   11.0        Use the calculated Patient Ratio above and the CHD Risk Table to determine the patient's CHD Risk.        ATP III CLASSIFICATION (LDL):  <100     mg/dL   Optimal  409-811  mg/dL   Near or Above                    Optimal  130-159  mg/dL   Borderline  914-782  mg/dL   High  >956     mg/dL   Very High* 21/30/8657   ALT 16 12/15/2012   AST 21 12/15/2012   NA 142 12/15/2012   K 3.7 12/15/2012   CL 108 12/15/2012   CREATININE 1.1 12/15/2012   BUN 17 12/15/2012   CO2 29 12/15/2012   TSH 4.68 11/30/2012   INR 0.97 07/15/2011   HGBA1C  Value: 5.3 (NOTE)  According to the ADA Clinical Practice Recommendations for 2011, when HbA1c is used as a screening test:   >=6.5%   Diagnostic of Diabetes Mellitus           (if abnormal result  is confirmed)  5.7-6.4%   Increased risk of developing Diabetes Mellitus  References:Diagnosis and Classification of Diabetes Mellitus,Diabetes Care,2011,34(Suppl 1):S62-S69 and Standards of Medical Care in         Diabetes - 2011,Diabetes Care,2011,34  (Suppl 1):S11-S61. 05/11/2010        Assessment & Plan:    See problem list. Medications and labs reviewed today.  Hypoglycemia - isolated on labs 11/2012 - s/p eval by endo - no abnormality identified

## 2013-01-12 NOTE — Assessment & Plan Note (Signed)
started paxil 11/2009, titrate up 02/2012  Added traz for sleep 04/2012 due to formulary change/limitations of #ambein/yr reassurance and redirection provided Support to pt and family

## 2013-02-23 IMAGING — CR DG HAND COMPLETE 3+V*R*
3 series · 3 of 3 positions shown · non-contrast
Comparison: 08/07/2009.

CLINICAL DATA: Right index finger swelling and bruising following a
fall today.  Swelling and bruising at the base of the right little
finger for the past 3 days, injured 1 year ago.

RIGHT HAND - COMPLETE 3+ VIEW

[x hand pa right]
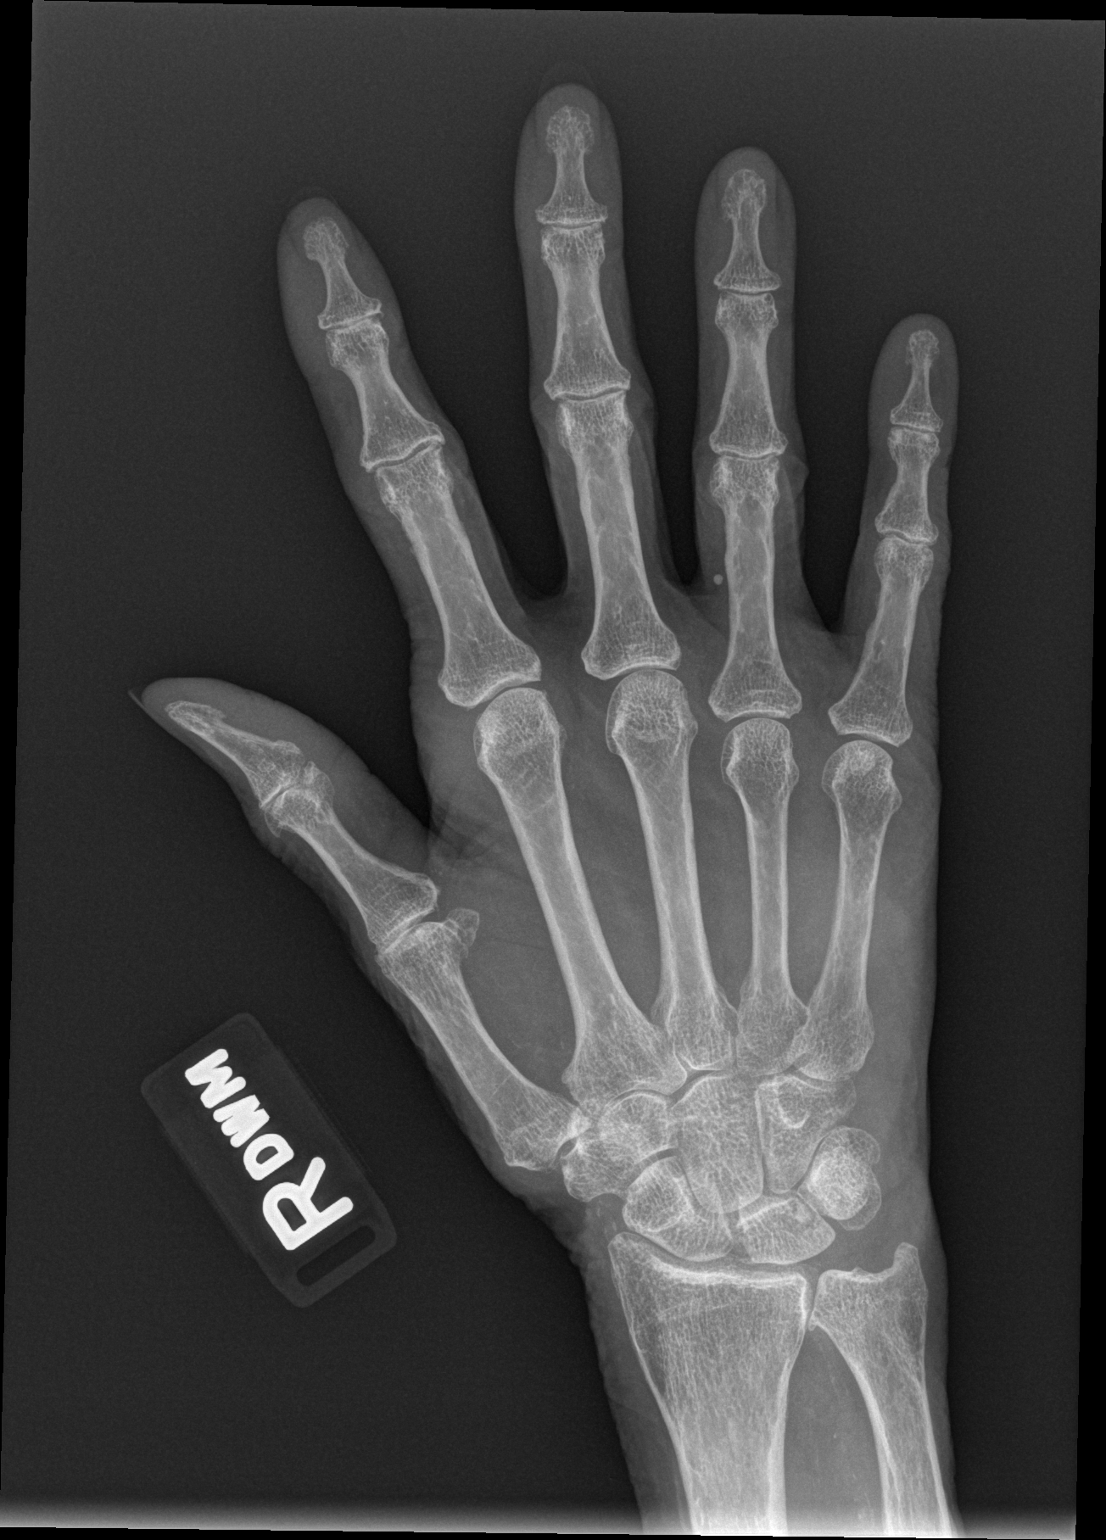

[x hand oblique right]
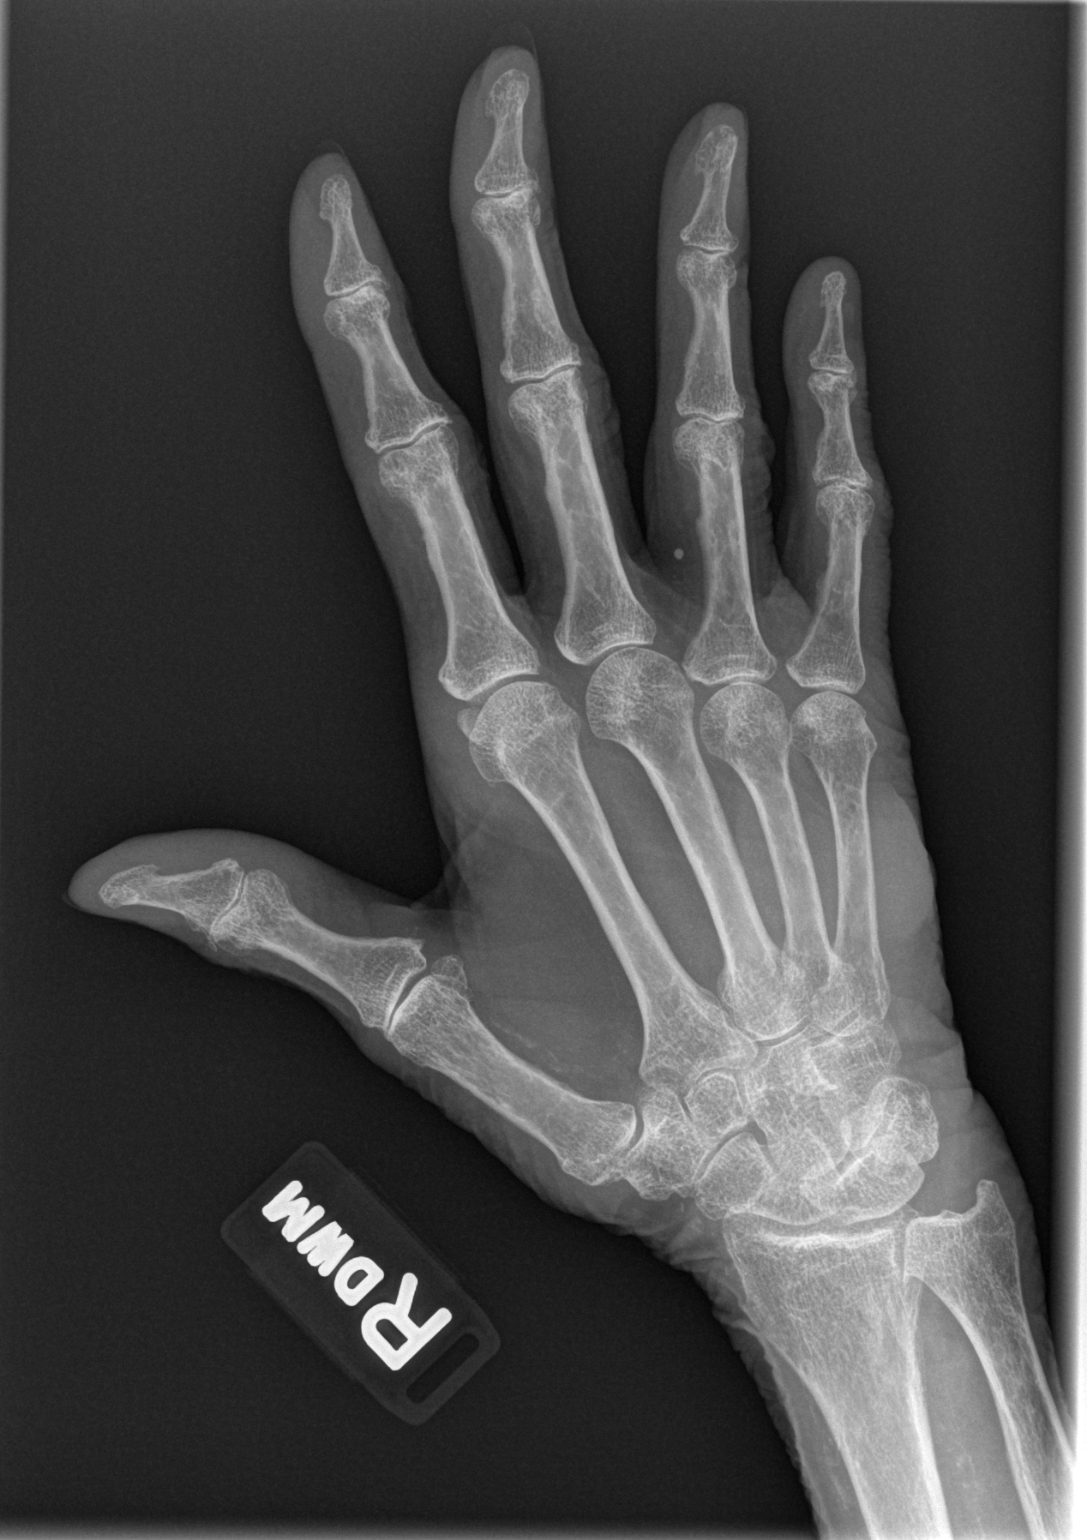

[x hand lat right]
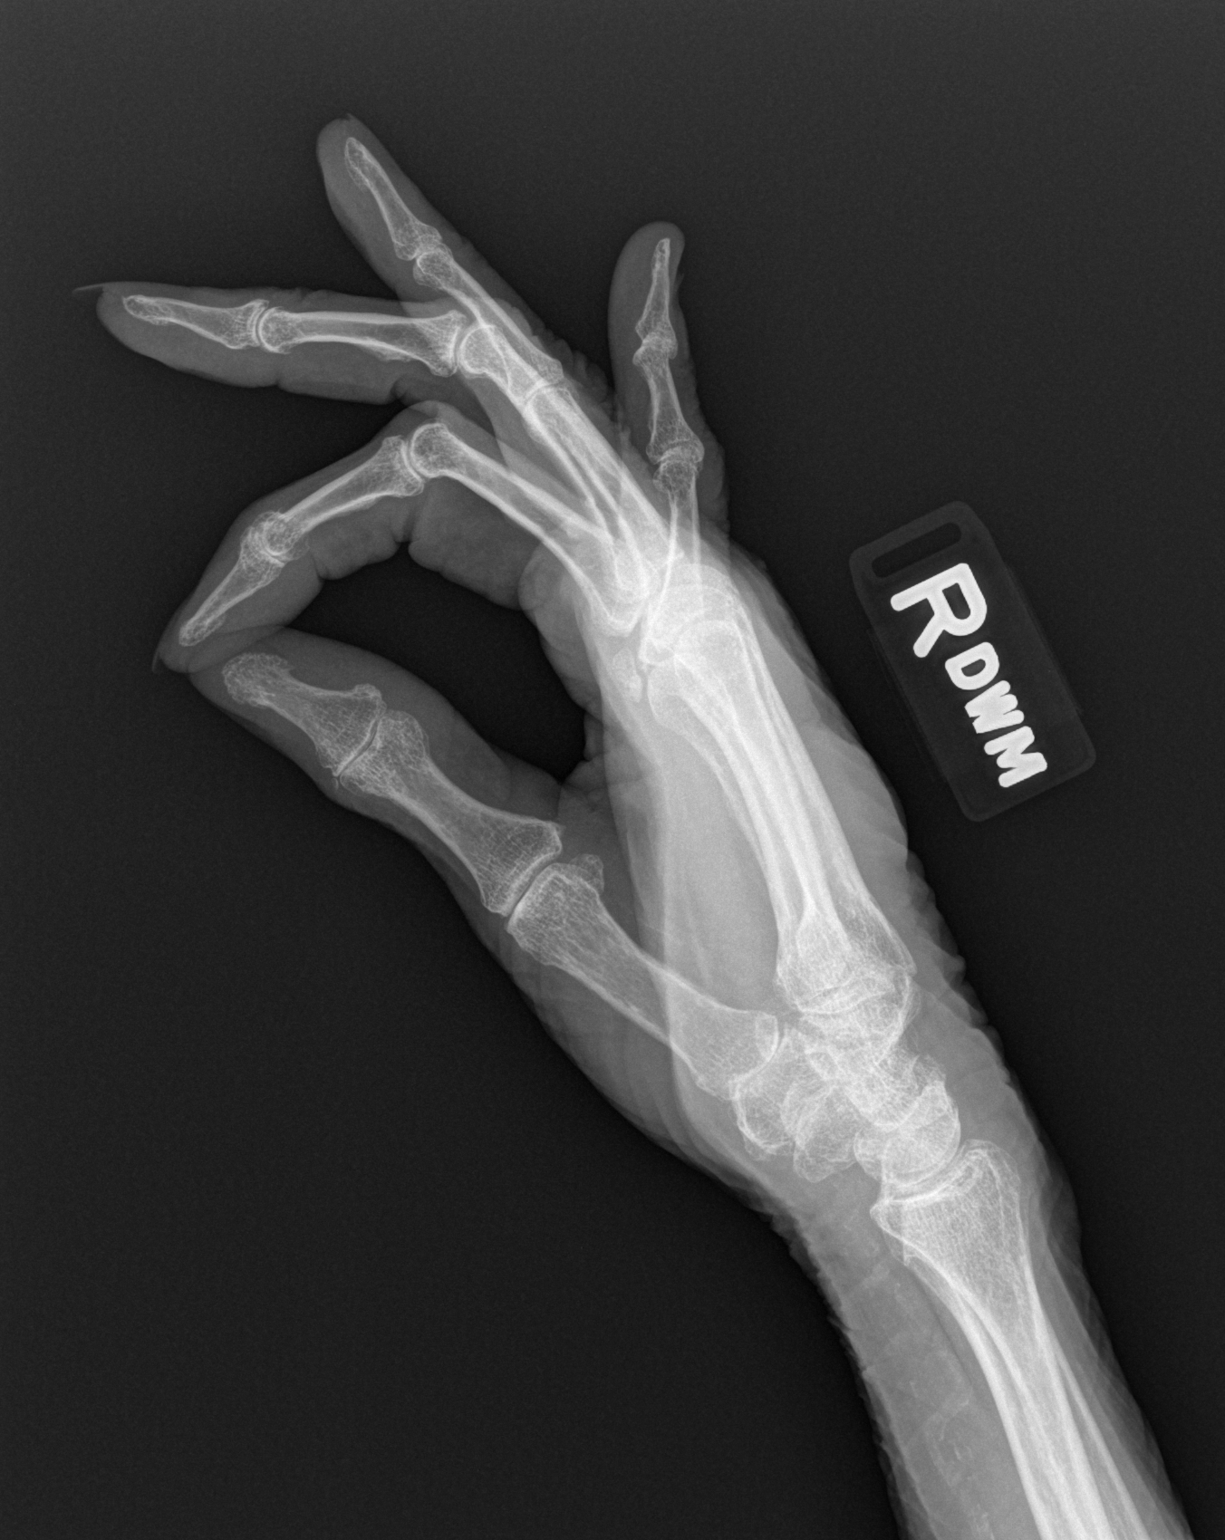

[3 of 3 positions shown; findings below may reference images not displayed]

FINDINGS: 2.4 cm oval area of increased soft tissue density
adjacent to the fifth metacarpal shaft.  No soft tissue gas seen.
No index finger fracture or dislocation seen.  Stable degenerative
spur formation involving multiple interphalangeal joints.
IMPRESSION: 1.  Focal soft tissue swelling or mass adjacent to the fifth
metacarpal shaft.
2.  No fracture or dislocation.
3.  Stable degenerative changes.

## 2013-02-23 IMAGING — CT CT HEAD W/O CM
1 of 2 series · 16 of 30 positions shown, 20 images · non-contrast
Comparison: 05/12/2010

CLINICAL DATA: Fall.  dementia

CT HEAD WITHOUT CONTRAST
TECHNIQUE: Contiguous axial images were obtained from the base of
the skull through the vertex without contrast.

[Series 3: recon 2: brain · axial · 0.47mm/px · z∈[+107,+232]mm · 16 of 56 slices shown, 20 images]
[im 3/56  brain]
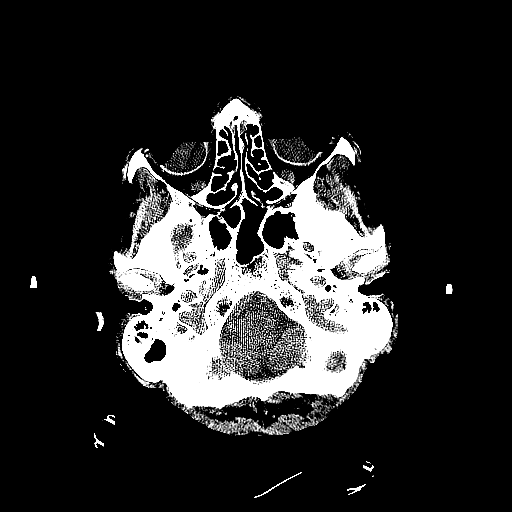
[im 3/56  bone]
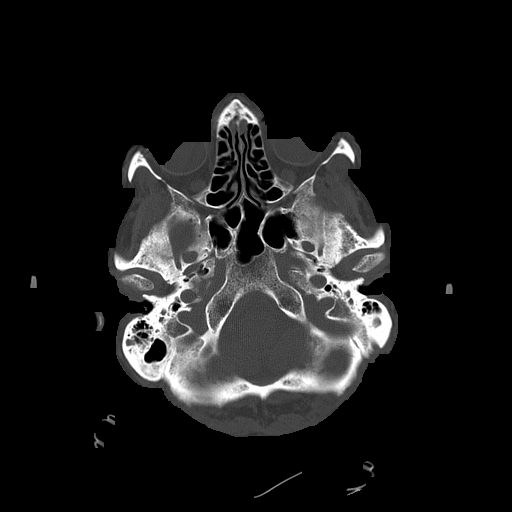
[im 6/56  brain]
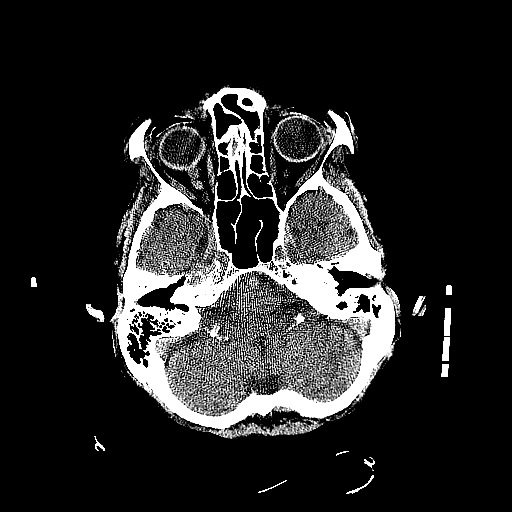
[im 9/56  brain]
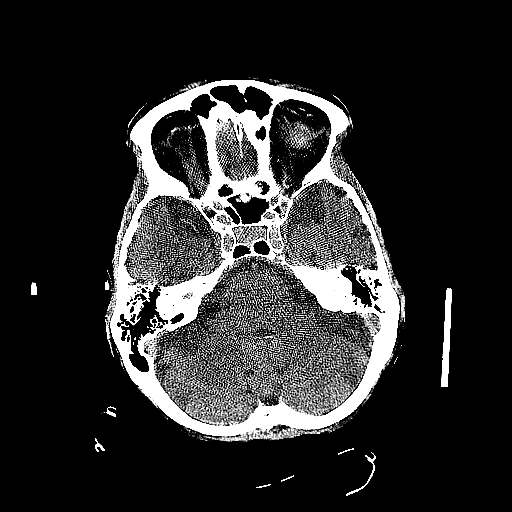
[im 12/56  brain]
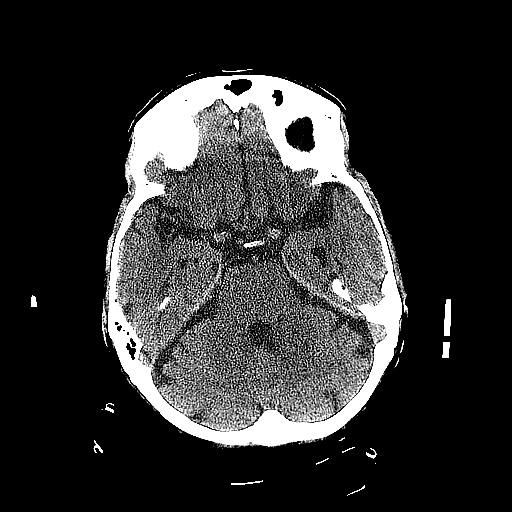
[im 18/56  brain]
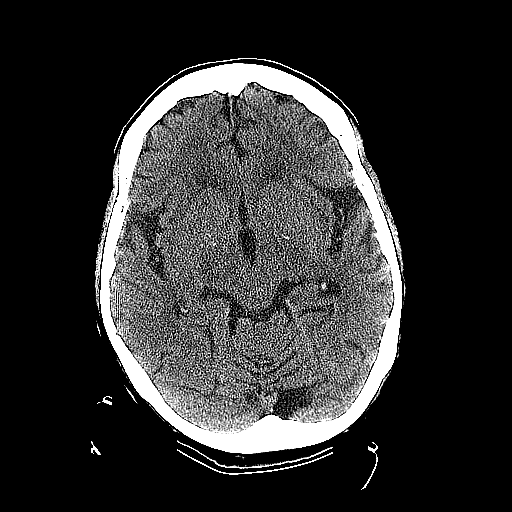
[im 18/56  bone]
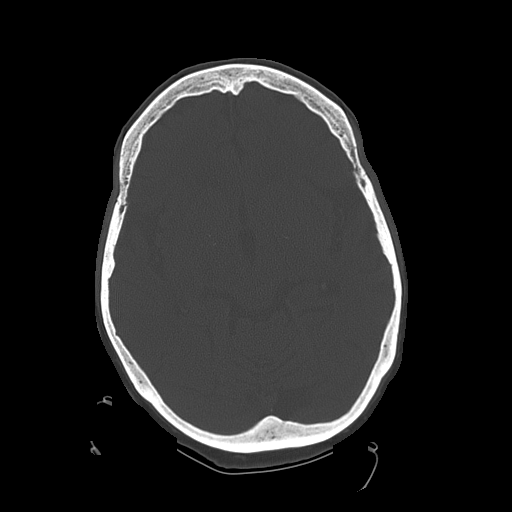
[im 21/56  brain]
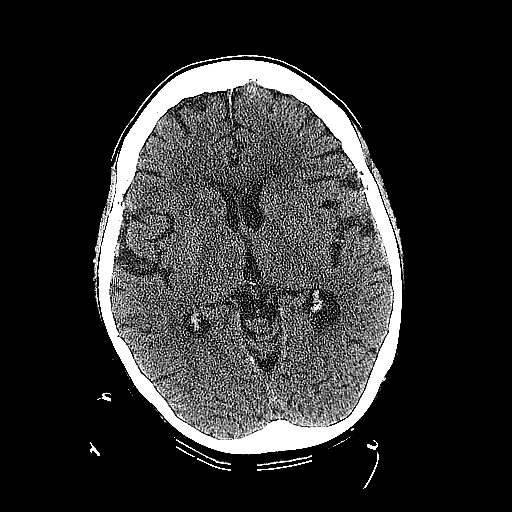
[im 24/56  brain]
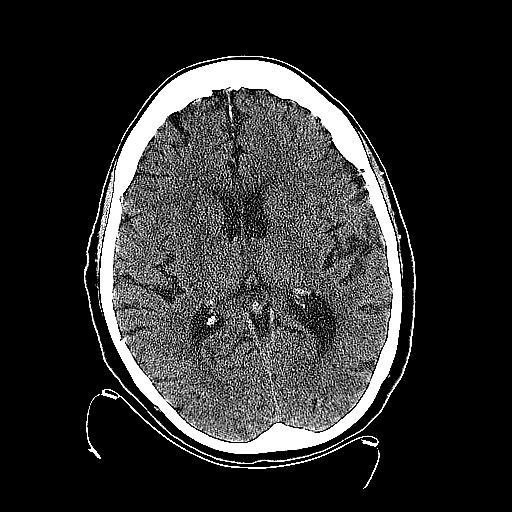
[im 27/56  brain]
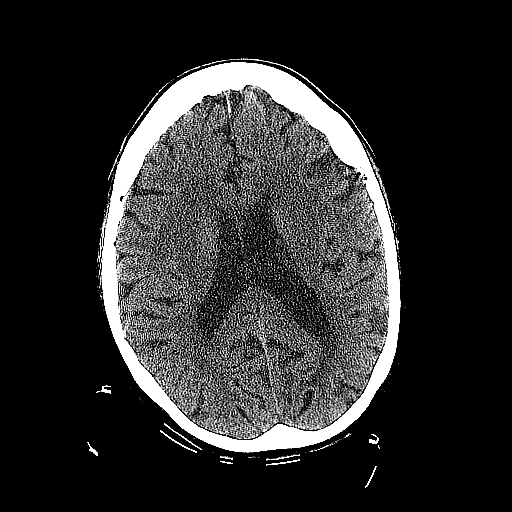
[im 29/56  brain]
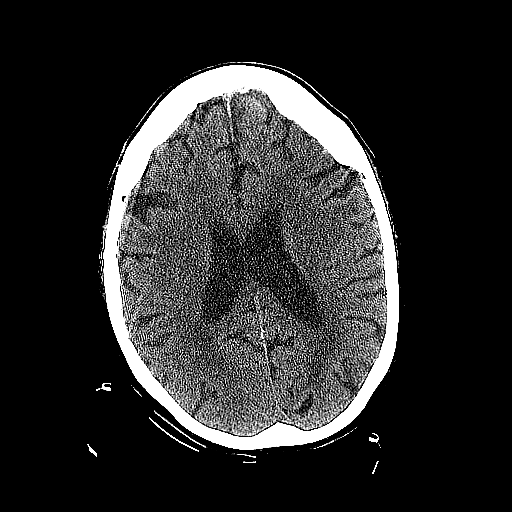
[im 29/56  bone]
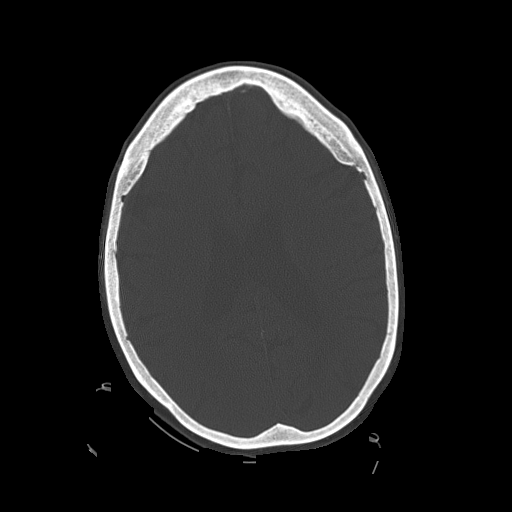
[im 32/56  brain]
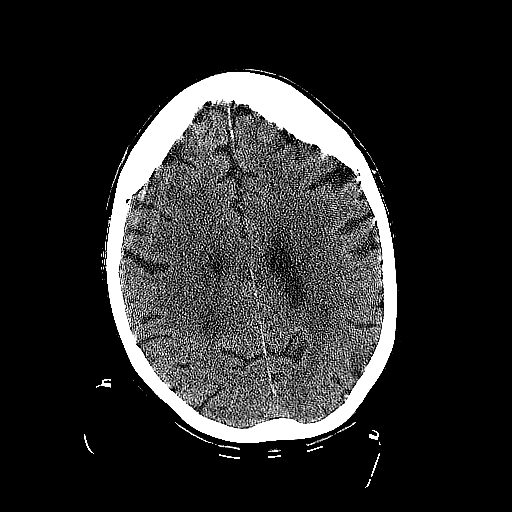
[im 35/56  brain]
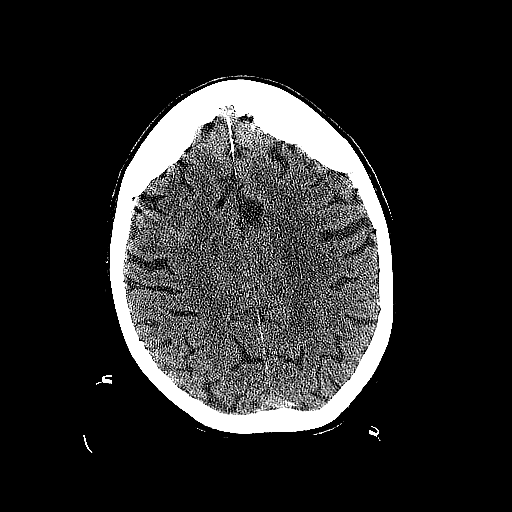
[im 38/56  brain]
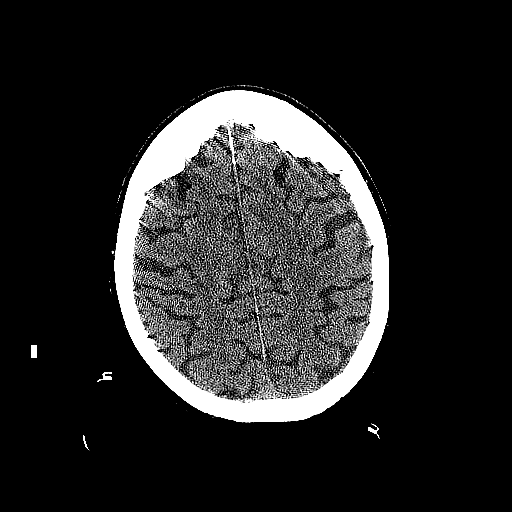
[im 44/56  brain]
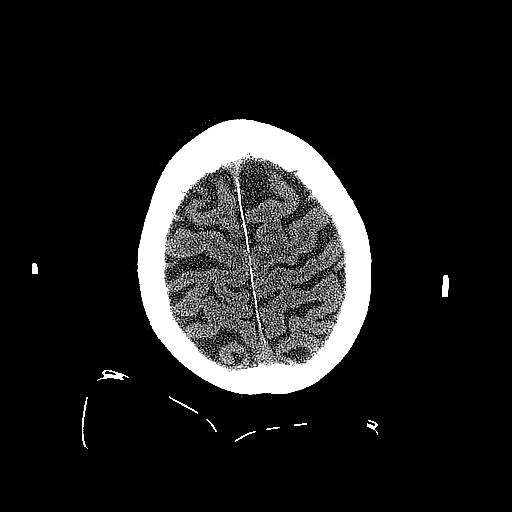
[im 44/56  bone]
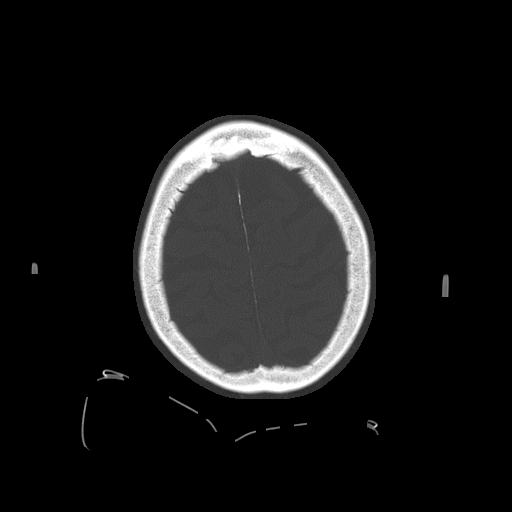
[im 47/56  brain]
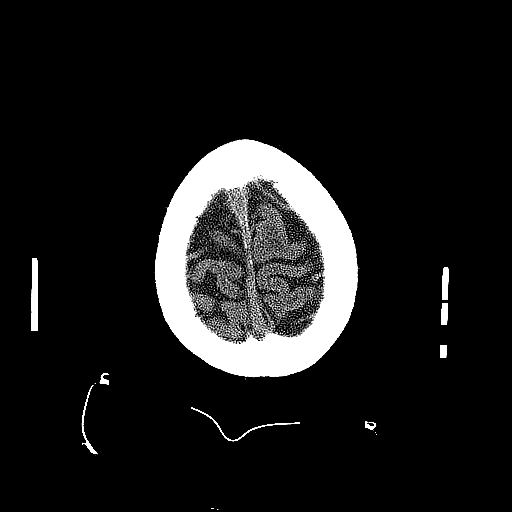
[im 50/56  brain]
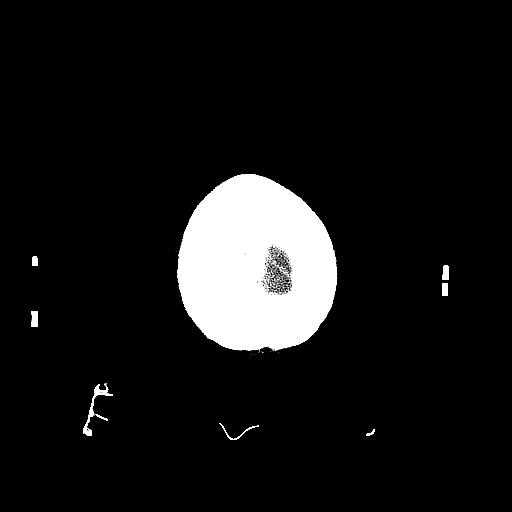
[im 53/56  brain]
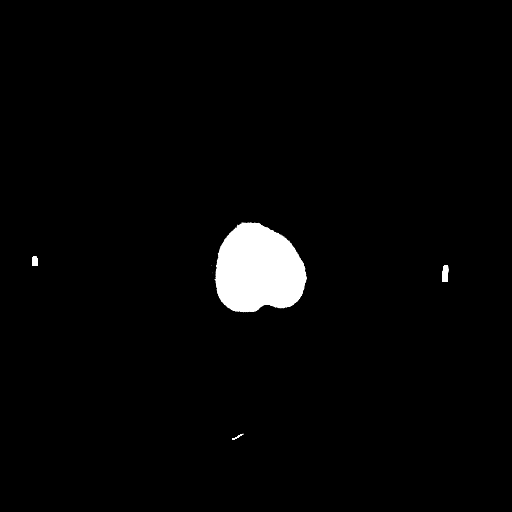

[16 of 30 positions shown; findings below may reference images not displayed]

FINDINGS: There is diffuse patchy low density throughout the
subcortical and periventricular white matter consistent with
chronic small vessel ischemic change.

There is prominence of the sulci and ventricles consistent with
brain atrophy.

The mastoid air cells and paranasal sinuses are clear.

The skull appears normal.
IMPRESSION: 1.  No acute intracranial abnormalities.
2.  Small vessel ischemic disease and brain atrophy.

## 2013-02-24 ENCOUNTER — Other Ambulatory Visit: Payer: Self-pay | Admitting: *Deleted

## 2013-02-24 MED ORDER — PAROXETINE HCL 20 MG PO TABS: 20.0000 mg | ORAL_TABLET | Freq: Every evening | ORAL | Status: DC

## 2013-03-10 ENCOUNTER — Other Ambulatory Visit: Payer: Self-pay | Admitting: *Deleted

## 2013-03-10 MED ORDER — LORAZEPAM 0.5 MG PO TABS: 0.2500 mg | ORAL_TABLET | Freq: Three times a day (TID) | ORAL | Status: DC | PRN

## 2013-03-10 NOTE — Telephone Encounter (Signed)
Faxed script back to [facility].../lmb 

## 2013-03-24 ENCOUNTER — Ambulatory Visit: Payer: 59 | Admitting: Internal Medicine

## 2013-04-12 ENCOUNTER — Other Ambulatory Visit: Payer: Self-pay | Admitting: *Deleted

## 2013-04-12 MED ORDER — LORAZEPAM 0.5 MG PO TABS: 0.2500 mg | ORAL_TABLET | Freq: Three times a day (TID) | ORAL | Status: DC | PRN

## 2013-04-12 NOTE — Telephone Encounter (Signed)
Faxed script back to [facility].../lmb 

## 2013-04-12 NOTE — Telephone Encounter (Signed)
Done hardcopy to robin  

## 2013-04-12 NOTE — Telephone Encounter (Signed)
Pls advice on refill since md is out of the office...Raechel Chute

## 2013-05-03 ENCOUNTER — Ambulatory Visit (INDEPENDENT_AMBULATORY_CARE_PROVIDER_SITE_OTHER): Payer: 59

## 2013-05-03 DIAGNOSIS — Z23 Encounter for immunization: Secondary | ICD-10-CM

## 2013-06-01 ENCOUNTER — Encounter: Payer: Self-pay | Admitting: Internal Medicine

## 2013-06-01 ENCOUNTER — Ambulatory Visit (INDEPENDENT_AMBULATORY_CARE_PROVIDER_SITE_OTHER): Payer: 59 | Admitting: Internal Medicine

## 2013-06-01 ENCOUNTER — Other Ambulatory Visit: Payer: Self-pay | Admitting: Internal Medicine

## 2013-06-01 VITALS — BP 118/78 | HR 80 | Temp 97.5°F | Wt 121.4 lb

## 2013-06-01 DIAGNOSIS — G309 Alzheimer's disease, unspecified: Principal | ICD-10-CM

## 2013-06-01 DIAGNOSIS — F0280 Dementia in other diseases classified elsewhere without behavioral disturbance: Secondary | ICD-10-CM

## 2013-06-01 NOTE — Progress Notes (Signed)
Pre-visit discussion using our clinic review tool. No additional management support is needed unless otherwise documented below in the visit note.  

## 2013-06-01 NOTE — Patient Instructions (Addendum)
It was good to see you today.  We have reviewed your prior records including labs and tests today  Medications reviewed and updated, no changes recommended at this time.  Please schedule followup in 6 months, call sooner if problems.  

## 2013-06-01 NOTE — Assessment & Plan Note (Signed)
Increased aricept to max dose 06/2011 and on namenda due to progressive dz - Continued decline Family supportive and involved, attentive to support and comfort rather than aggressive eval Signed and provided dtr with MOST form to clarify DNR status as requested in 08/2012- order placed for same 08/2012 Support offered to family

## 2013-06-01 NOTE — Progress Notes (Signed)
Subjective:    Patient ID: Anna [name], female    DOB: [birth year], 80 y.o.   MRN: [id number]  HPI  here for follow up - reviewed interval hx and events Decline in overall health per family - in memory >stamina   Past Medical History  Diagnosis Date  . COLONIC POLYPS, ADENOMATOUS   . MITRAL REGURGITATION   . CORONARY, ARTERIOSCLEROSIS 05/1998    PCI BMS to mid LAD 05/1998, repeat cath in 02/07 revealed mild in-stent restenosis, otherwise patent)  . Irritable bowel syndrome   . ALZHEIMERS DISEASE   . ANXIETY   . HYPERCHOLESTEROLEMIA   . HERNIA, HIATAL, NONCONGENITAL   . Abnormal chest CT 04/2010    RUL nodule  . Angina   . H/O hiatal hernia     Review of Systems  Constitutional: Positive for fatigue. Negative for fever and unexpected weight change.  Neurological: Negative for syncope and speech difficulty.  Psychiatric/Behavioral: Positive for sleep disturbance. The patient is nervous/anxious.        Objective:   Physical Exam BP 118/78  Pulse 80  Temp(Src) 97.5 F (36.4 C) (Oral)  Wt 121 lb 6 oz (55.055 kg) Wt Readings from Last 3 Encounters:  06/01/13 121 lb 6 oz (55.055 kg)  01/12/13 119 lb 12.8 oz (54.341 kg)  12/15/12 118 lb 8 oz (53.751 kg)   Constitutional: She appears well-developed and well-nourished. No distress. Dtr at side Cardiovascular: Normal rate, regular rhythm and normal heart sounds.  No murmur heard. No BLE edema. Pulmonary/Chest: Effort normal and breath sounds normal. No respiratory distress. She has no wheezes.  Neurological: She is alert and oriented to person - but not place or time. No cranial nerve deficit. Coordination normal.    Lab Results  Component Value Date   WBC 4.0* 11/30/2012   HGB 14.0 11/30/2012   HCT 42.0 11/30/2012   PLT 205.0 11/30/2012   GLUCOSE 77 12/15/2012   CHOL 220* 09/18/2011   TRIG 49.0 09/18/2011   HDL 86.00 09/18/2011   LDLDIRECT 119.9 09/18/2011   LDLCALC  Value: 113        Total Cholesterol/HDL:CHD Risk Coronary  Heart Disease Risk Table                     Men   Women  1/2 Average Risk   3.4   3.3  Average Risk       5.0   4.4  2 X Average Risk   9.6   7.1  3 X Average Risk  23.4   11.0        Use the calculated Patient Ratio above and the CHD Risk Table to determine the patient's CHD Risk.        ATP III CLASSIFICATION (LDL):  <100     mg/dL   Optimal  100-129  mg/dL   Near or Above                    Optimal  130-159  mg/dL   Borderline  160-189  mg/dL   High  >190     mg/dL   Very High* 05/12/2010   ALT 16 12/15/2012   AST 21 12/15/2012   NA 142 12/15/2012   K 3.7 12/15/2012   CL 108 12/15/2012   CREATININE 1.1 12/15/2012   BUN 17 12/15/2012   CO2 29 12/15/2012   TSH 4.68 11/30/2012   INR 0.97 07/15/2011   HGBA1C  Value: 5.3 (NOTE)  According to the ADA Clinical Practice Recommendations for 2011, when HbA1c is used as a screening test:   >=6.5%   Diagnostic of Diabetes Mellitus           (if abnormal result  is confirmed)  5.7-6.4%   Increased risk of developing Diabetes Mellitus  References:Diagnosis and Classification of Diabetes Mellitus,Diabetes TRRN,1657,90(XYBFX 1):S62-S69 and Standards of Medical Care in         Diabetes - 2011,Diabetes OVAN,1916,60  (Suppl 1):S11-S61. 05/11/2010        Assessment & Plan:    See problem list. Medications and labs reviewed today.

## 2013-06-02 ENCOUNTER — Telehealth: Payer: Self-pay | Admitting: Internal Medicine

## 2013-06-02 NOTE — Telephone Encounter (Signed)
Relevant patient education mailed to patient.  

## 2013-06-02 NOTE — Telephone Encounter (Signed)
Ok to refill? Last OV 1.7.15 Last filled 11.18.14

## 2013-06-10 ENCOUNTER — Other Ambulatory Visit: Payer: Self-pay | Admitting: *Deleted

## 2013-06-10 MED ORDER — DONEPEZIL HCL 10 MG PO TABS: 10.0000 mg | ORAL_TABLET | Freq: Every day | ORAL | Status: DC

## 2013-08-03 ENCOUNTER — Other Ambulatory Visit: Payer: Self-pay | Admitting: *Deleted

## 2013-08-03 MED ORDER — LORAZEPAM 0.5 MG PO TABS: ORAL_TABLET | ORAL | Status: DC

## 2013-08-03 NOTE — Telephone Encounter (Signed)
Faxed back to [facility]...Anna [name]

## 2013-09-12 ENCOUNTER — Other Ambulatory Visit: Payer: Self-pay | Admitting: *Deleted

## 2013-09-12 MED ORDER — PAROXETINE HCL 20 MG PO TABS: 20.0000 mg | ORAL_TABLET | Freq: Every evening | ORAL | Status: DC

## 2013-09-14 ENCOUNTER — Other Ambulatory Visit: Payer: Self-pay | Admitting: *Deleted

## 2013-09-14 MED ORDER — MEMANTINE HCL 5 MG PO TABS: 5.0000 mg | ORAL_TABLET | Freq: Two times a day (BID) | ORAL | Status: DC

## 2013-09-26 ENCOUNTER — Other Ambulatory Visit: Payer: Self-pay | Admitting: *Deleted

## 2013-09-26 MED ORDER — LORAZEPAM 0.5 MG PO TABS: ORAL_TABLET | ORAL | Status: DC

## 2013-09-27 NOTE — Telephone Encounter (Signed)
Faxed script back to [facility].../lmb 

## 2013-10-14 ENCOUNTER — Ambulatory Visit (INDEPENDENT_AMBULATORY_CARE_PROVIDER_SITE_OTHER): Payer: 59 | Admitting: Internal Medicine

## 2013-10-14 ENCOUNTER — Encounter: Payer: Self-pay | Admitting: Internal Medicine

## 2013-10-14 VITALS — BP 126/70 | HR 74 | Temp 98.7°F | Wt 120.8 lb

## 2013-10-14 DIAGNOSIS — S61409A Unspecified open wound of unspecified hand, initial encounter: Secondary | ICD-10-CM

## 2013-10-14 DIAGNOSIS — G309 Alzheimer's disease, unspecified: Secondary | ICD-10-CM

## 2013-10-14 DIAGNOSIS — S61412A Laceration without foreign body of left hand, initial encounter: Secondary | ICD-10-CM | POA: Insufficient documentation

## 2013-10-14 DIAGNOSIS — F0280 Dementia in other diseases classified elsewhere without behavioral disturbance: Secondary | ICD-10-CM

## 2013-10-14 NOTE — Progress Notes (Signed)
Pre visit review using our clinic review tool, if applicable. No additional management support is needed unless otherwise documented below in the visit note. 

## 2013-10-14 NOTE — Progress Notes (Signed)
   Subjective:    Patient ID: Anna [name], female    DOB: 1919-09-09, 80 y.o.   MRN: [id number]  HPI  She sustained an injury to the dorsum of the left a.m. working in her garden. Apparently she struck it against the corner of a lattice 10/03/13.  She was seen in urgent care in [place], [place]. She was placed on Bactrim orally and topical Silvadene. This was to be covered with a dressing; but her daughter states that she removes the dressing & wipes off the Silvadene  Her daughter is also concerned because she does go to sleep with trazodone but wakes up early in the morning and " rambles about the house". She's questioning increasing the dose       Review of Systems   She denies fever chills, sweats.  There's been no purulence     Objective:   Physical Exam Thin but adequately nourished  She is alert but pleasantly confused. She's in no acute distress  She has no lymphadenopathy about the neck , axilla  or epitrochlear areas  Chest is clear with no abnormal breath sounds or increased WOB  She has a grade 1 systolic murmur.  Good radial artery pulses  There is a " V shaped" laceration of the dorsum of her  left hand has a well-developed eschar. There is no associated cellulitis or purulence.        Assessment & Plan:  #1 laceration, excellent healing with no evidence of cellulitis or purulence  #2 dementia with nocturnal rambling. I would hesitate to increase the trazodone because of her age and increased risk of balance issues and potential for fall.  Plan: She should report signs of cellulitis such as fever red streaks going up the arm. The wound should be kept as dry as possible without application of any medication.

## 2013-10-14 NOTE — Patient Instructions (Signed)
Report Warning  signs as discussed (red streaks, pus, fever, increasing pain).

## 2013-11-28 ENCOUNTER — Telehealth: Payer: Self-pay

## 2013-11-28 MED ORDER — LORAZEPAM 0.5 MG PO TABS: ORAL_TABLET | ORAL | Status: DC

## 2013-11-28 NOTE — Telephone Encounter (Signed)
Printed rx and put on PCP's desk to sign, will then fax tfo [facility].

## 2013-11-30 ENCOUNTER — Ambulatory Visit: Payer: 59 | Admitting: Internal Medicine

## 2013-12-06 ENCOUNTER — Other Ambulatory Visit: Payer: Self-pay | Admitting: *Deleted

## 2013-12-06 MED ORDER — DONEPEZIL HCL 10 MG PO TABS: 10.0000 mg | ORAL_TABLET | Freq: Every day | ORAL | Status: AC

## 2013-12-08 ENCOUNTER — Ambulatory Visit: Payer: 59 | Admitting: Internal Medicine

## 2014-01-04 ENCOUNTER — Ambulatory Visit: Payer: 59 | Admitting: Internal Medicine

## 2014-02-01 ENCOUNTER — Other Ambulatory Visit: Payer: Self-pay

## 2014-02-01 MED ORDER — LORAZEPAM 0.5 MG PO TABS
ORAL_TABLET | ORAL | Status: DC
Start: 2014-02-01 — End: 2014-04-07

## 2014-02-27 ENCOUNTER — Ambulatory Visit (INDEPENDENT_AMBULATORY_CARE_PROVIDER_SITE_OTHER): Payer: 59 | Admitting: *Deleted

## 2014-02-27 ENCOUNTER — Ambulatory Visit: Payer: 59 | Admitting: Internal Medicine

## 2014-02-27 DIAGNOSIS — Z23 Encounter for immunization: Secondary | ICD-10-CM

## 2014-04-07 ENCOUNTER — Other Ambulatory Visit: Payer: Self-pay

## 2014-04-07 MED ORDER — LORAZEPAM 0.5 MG PO TABS: ORAL_TABLET | ORAL | Status: DC

## 2014-04-10 ENCOUNTER — Other Ambulatory Visit: Payer: Self-pay | Admitting: Geriatric Medicine

## 2014-04-10 MED ORDER — PAROXETINE HCL 20 MG PO TABS: 20.0000 mg | ORAL_TABLET | Freq: Every evening | ORAL | Status: DC

## 2014-05-01 ENCOUNTER — Telehealth: Payer: Self-pay | Admitting: *Deleted

## 2014-05-01 NOTE — Telephone Encounter (Signed)
Patient Name: Anna [name] Gender: Female DOB: May 20, 1920 Age: 80 Y 2 M 12 D Return Phone Number: [phone] (Primary) Address: [address]/State/Zip: [place] [address] Client [facility] - Client Client Site [facility] - Night Physician [place], [place] Type Call Call Type Triage / Clinical Caller Name Abel Presto Relationship To Patient Daughter Return Phone Number [phone] (Primary) Chief Complaint Dizziness Initial Comment Caller States Mother is dizzy, tired, BP 142/97, PO2 is 97 PreDisposition Home Care Nurse Assessment Nurse: Gershon Mussel, RN, Caryl Pina Date/Time Eilene Ghazi Time): 04/29/2014 5:48:12 PM Confirm and document reason for call. If symptomatic, describe symptoms. ---Caller states: "Mother is dizzy, tired, BP 142/97, HR is 65 PO2 is 97 Current medications: Toprolol, Aricept, Namenda, and a sleeping pill." Has the patient traveled out of the country within the last 30 days? ---No Does the patient require triage? ---Yes Related visit to physician within the last 2 weeks? ---No Does the PT have any chronic conditions? (i.e. diabetes, asthma, etc.) ---Yes List chronic conditions. ---Pacemaker history Guidelines Guideline Title Affirmed Question Affirmed Notes Nurse Date/Time (Eastern Time) Dizziness - Lightheadedness [1] MODERATE dizziness (e.g., interferes with normal activities) AND [2] has NOT been evaluated by physician for this (Exception: dizziness caused by heat exposure, sudden standing, or poor fluid intake) Gershon Mussel, Casey Burkitt 04/29/2014 5:52:26 PM Disp. Time Eilene Ghazi Time) Disposition Final User 04/29/2014 5:55:31 PM See Physician within 24 Hours Yes Moses, RN, Caryl Pina PLEASE NOTE: All timestamps contained within this report are represented as [place] Standard Time. CONFIDENTIALTY NOTICE: This fax transmission is intended only for the addressee. It contains information that is legally privileged,  confidential or otherwise protected from use or disclosure. If you are not the intended recipient, you are strictly prohibited from reviewing, disclosing, copying using or disseminating any of this information or taking any action in reliance on or regarding this information. If you have received this fax in error, please notify us immediately by telephone so that we can arrange for its return to [place]. Phone: [phone], Toll-Free: [phone], Fax: [phone] Page: 2 of 2 Call Id: [id number] Caller Understands: Yes Disagree/Comply: Comply Care Advice Given Per Guideline SEE PHYSICIAN WITHIN 24 HOURS: * IF OFFICE WILL BE OPEN: You need to be examined within the next 24 hours. Call your doctor when the office opens, and make an appointment. * IF OFFICE WILL BE CLOSED AND NO PCP TRIAGE: You need to be examined within the next 24 hours. Go to _________ at your convenience. FLUIDS: Drink several glasses of fruit juice, other clear fluids or water. This will improve hydration and blood glucose. If the weather is hot, make sure the fluids are cold. REST: Lie down with feet elevated for 1 hour. This will improve circulation and increase blood flow to the brain. CALL BACK IF: * Passes out (faints) * You become worse. CARE ADVICE given per Dizziness (Adult) guideline. Referrals REFERRED TO PCP OFFICE

## 2014-05-15 ENCOUNTER — Other Ambulatory Visit: Payer: Self-pay | Admitting: Internal Medicine

## 2014-05-25 ENCOUNTER — Telehealth: Payer: Self-pay | Admitting: Internal Medicine

## 2014-05-25 ENCOUNTER — Ambulatory Visit: Payer: 59 | Admitting: Internal Medicine

## 2014-05-25 MED ORDER — LORAZEPAM 0.5 MG PO TABS: ORAL_TABLET | ORAL | Status: AC

## 2014-05-25 NOTE — Telephone Encounter (Signed)
MD out of office pls advise.../lmb 

## 2014-05-25 NOTE — Telephone Encounter (Signed)
Pt daughter called in said that pt needs refill on her:  LORazepam (ATIVAN) 0.5 MG tablet [[id number]]

## 2014-05-25 NOTE — Telephone Encounter (Signed)
Called left a detailed message for the patient that hardcopy for Lorazepam was faxed to [facility]

## 2014-05-25 NOTE — Telephone Encounter (Signed)
Done hardcopy to robin  

## 2014-06-06 ENCOUNTER — Ambulatory Visit: Payer: Medicaid Other | Admitting: Family

## 2014-06-09 ENCOUNTER — Encounter: Payer: Self-pay | Admitting: Family

## 2014-06-09 ENCOUNTER — Ambulatory Visit (INDEPENDENT_AMBULATORY_CARE_PROVIDER_SITE_OTHER): Payer: Medicare Other | Admitting: Family

## 2014-06-09 ENCOUNTER — Other Ambulatory Visit (INDEPENDENT_AMBULATORY_CARE_PROVIDER_SITE_OTHER): Payer: Medicare Other

## 2014-06-09 VITALS — BP 122/54 | HR 69 | Temp 97.9°F | Resp 18 | Ht 62.0 in | Wt 120.8 lb

## 2014-06-09 DIAGNOSIS — R4182 Altered mental status, unspecified: Secondary | ICD-10-CM

## 2014-06-09 LAB — CBC
HCT: 41.9 % (ref 36.0–46.0)
Hemoglobin: 13.8 g/dL (ref 12.0–15.0)
MCHC: 32.8 g/dL (ref 30.0–36.0)
MCV: 93.1 fl (ref 78.0–100.0)
Platelets: 222 10*3/uL (ref 150.0–400.0)
RBC: 4.5 Mil/uL (ref 3.87–5.11)
RDW: 15.2 % (ref 11.5–15.5)
WBC: 3.7 10*3/uL — ABNORMAL LOW (ref 4.0–10.5)

## 2014-06-09 LAB — POCT URINALYSIS DIPSTICK
BILIRUBIN UA: NEGATIVE
Blood, UA: NEGATIVE
GLUCOSE UA: NEGATIVE
KETONES UA: NEGATIVE
Leukocytes, UA: NEGATIVE
Nitrite, UA: NEGATIVE
PROTEIN UA: NEGATIVE
SPEC GRAV UA: 1.02
Urobilinogen, UA: NEGATIVE
pH, UA: 6

## 2014-06-09 LAB — BASIC METABOLIC PANEL
BUN: 10 mg/dL (ref 6–23)
CO2: 30 mEq/L (ref 19–32)
Calcium: 9.2 mg/dL (ref 8.4–10.5)
Chloride: 105 mEq/L (ref 96–112)
Creatinine, Ser: 0.85 mg/dL (ref 0.40–1.20)
GFR: 66.44 mL/min (ref >60.00)
Glucose, Bld: 90 mg/dL (ref 70–99)
Potassium: 3.6 mEq/L (ref 3.5–5.1)
Sodium: 142 mEq/L (ref 135–145)

## 2014-06-09 NOTE — Assessment & Plan Note (Signed)
Patient presents with increased altered mental status over the last 2 weeks. MMSE score 7 out of 10 indicating severe cognitive disability. Obtain urinalysis and CBC to rule out delirium and potential infection. Recommend awakening patient to eat and maintain a regular day and night cycle as much as possible. Concerned this is worsening of her Alzheimer's disease. Daughter and family are aware of the situation. An patient is a DO NOT RESUSCITATE at this time.  Note - in office urinalysis was negative.

## 2014-06-09 NOTE — Progress Notes (Signed)
Subjective:    Patient ID: Anna [name], female    DOB: [birth year], 81 y.o.   MRN: [id number]  Chief Complaint  Patient presents with  . Anorexia     x2 week not eating good, not drinking good, sleeping too much and mental status has altered a great amount    HPI:  Anna [name] is a 81 y.o. female who presents today for an acute visit.  Her daughter is present for today's visit.   Patient with a known history of Alzheimer's presents today with the acute symptoms of not eating good, not drinking good, and sleeping too much for approximately 2 weeks. Daughter indicates she ate about a 1/2 sausage biscuit this morning and about 12 ounces of tea.   Excessive sleeping - indicates she goes to bed at 530 in the evening and wakes up about 24 hours later unless awoken. States she has noted increased confusion lately.   Family has withheld her ativan for about 2 weeks.   Wt Readings from Last 3 Encounters:  06/09/14 120 lb 12.8 oz (54.795 kg)  10/14/13 120 lb 12.8 oz (54.795 kg)  06/01/13 121 lb 6 oz (55.055 kg)    Allergies  Allergen Reactions  . Diazepam Other (See Comments)    "slows her respirations and her blood pressure bottoms"    Current Outpatient Prescriptions on File Prior to Visit  Medication Sig Dispense Refill  . aspirin 81 MG EC tablet Take 81 mg by mouth every evening.     . docusate sodium (COLACE) 100 MG capsule Take 100 mg by mouth every evening.     . donepezil (ARICEPT) 10 MG tablet Take 1 tablet (10 mg total) by mouth at bedtime. 30 tablet 5  . LORazepam (ATIVAN) 0.5 MG tablet TAKE 1/2 TO 1 TABLETS BY MOUTH EVERY 8 HOURS AS NEEDED FOR ANXIETY/DIZZINESS 30 tablet 1  . memantine (NAMENDA) 5 MG tablet Take 1 tablet (5 mg total) by mouth 2 (two) times daily. 60 tablet 5  . Multiple Vitamin (MULTIVITAMIN WITH MINERALS) TABS Take 1 tablet by mouth every evening. One Daily for Women 50+ Adv Po    . PARoxetine (PAXIL) 20 MG tablet Take 1 tablet (20 mg total) by  mouth every evening. 30 tablet 5  . Sulfamethoxazole-Trimethoprim (BACTRIM PO) Take by mouth 2 (two) times daily.    . nitroGLYCERIN (NITROSTAT) 0.4 MG SL tablet Place 0.4 mg under the tongue every 5 (five) minutes x 3 doses as needed. For chest pain     No current facility-administered medications on file prior to visit.    Review of Systems  Constitutional: Negative for appetite change.  Psychiatric/Behavioral: Positive for confusion.      Objective:    BP 122/54 mmHg  Pulse 69  Temp(Src) 97.9 F (36.6 C) (Oral)  Resp 18  Ht 5\' 2"  (1.575 m)  Wt 120 lb 12.8 oz (54.795 kg)  BMI 22.09 kg/m2  SpO2 97% Nursing note and vital signs reviewed.  Physical Exam  Constitutional: She appears well-developed and well-nourished. No distress.  Cardiovascular: Normal rate, regular rhythm, normal heart sounds and intact distal pulses.   Pulmonary/Chest: Effort normal and breath sounds normal.  Abdominal: Soft. Bowel sounds are normal. She exhibits no distension and no mass. There is no tenderness. There is no rebound and no guarding.  Neurological: She is alert.  MMSE 7/30  Skin: Skin is warm and dry.  Psychiatric: She has a normal mood and affect. Her speech is normal. She  is slowed. Cognition and memory are impaired. She expresses inappropriate judgment. She exhibits abnormal recent memory and abnormal remote memory.       Assessment & Plan:

## 2014-06-09 NOTE — Patient Instructions (Addendum)
Thank you for choosing [facility].  Summary/Instructions:  If your symptoms worsen or fail to improve, please contact our office for further instruction, or in case of emergency go directly to the emergency room at the closest medical facility.    Recommend awakening for meals when it is time to eat and attempting to maintain a day/night cycle.  We will check and rule out any urinary or systemic infections.

## 2014-06-09 NOTE — Progress Notes (Signed)
Pre visit review using our clinic review tool, if applicable. No additional management support is needed unless otherwise documented below in the visit note. 

## 2014-06-10 ENCOUNTER — Telehealth: Payer: Self-pay | Admitting: Family

## 2014-06-10 NOTE — Telephone Encounter (Signed)
Please inform the patient's family that her urine and blood work indicated there were no signs of infection. Therefore we will have to continue to monitor the altered mental status for worsening.

## 2014-06-12 NOTE — Telephone Encounter (Signed)
Please call her back at [phone]

## 2014-06-12 NOTE — Telephone Encounter (Signed)
Tried calling pt. No answer. Left VM for her to call back

## 2014-06-12 NOTE — Telephone Encounter (Signed)
Spoke with pts daughter and let her know the results. She understood.

## 2014-07-09 ENCOUNTER — Emergency Department (HOSPITAL_COMMUNITY)
Admission: EM | Admit: 2014-07-09 | Discharge: 2014-07-09 | Disposition: A | Discharge status: Home / Self Care | Payer: Medicare Other | Attending: Emergency Medicine | Admitting: Emergency Medicine

## 2014-07-09 ENCOUNTER — Encounter (HOSPITAL_COMMUNITY): Payer: Self-pay | Admitting: *Deleted

## 2014-07-09 ENCOUNTER — Emergency Department (HOSPITAL_COMMUNITY): Payer: Medicare Other

## 2014-07-09 DIAGNOSIS — Z8639 Personal history of other endocrine, nutritional and metabolic disease: Secondary | ICD-10-CM | POA: Diagnosis not present

## 2014-07-09 DIAGNOSIS — Z9861 Coronary angioplasty status: Secondary | ICD-10-CM | POA: Insufficient documentation

## 2014-07-09 DIAGNOSIS — Z86010 Personal history of colonic polyps: Secondary | ICD-10-CM | POA: Insufficient documentation

## 2014-07-09 DIAGNOSIS — R404 Transient alteration of awareness: Secondary | ICD-10-CM | POA: Diagnosis not present

## 2014-07-09 DIAGNOSIS — I080 Rheumatic disorders of both mitral and aortic valves: Secondary | ICD-10-CM | POA: Diagnosis not present

## 2014-07-09 DIAGNOSIS — Y92009 Unspecified place in unspecified non-institutional (private) residence as the place of occurrence of the external cause: Secondary | ICD-10-CM | POA: Insufficient documentation

## 2014-07-09 DIAGNOSIS — R011 Cardiac murmur, unspecified: Secondary | ICD-10-CM | POA: Diagnosis not present

## 2014-07-09 DIAGNOSIS — R0781 Pleurodynia: Secondary | ICD-10-CM

## 2014-07-09 DIAGNOSIS — Z950 Presence of cardiac pacemaker: Secondary | ICD-10-CM | POA: Insufficient documentation

## 2014-07-09 DIAGNOSIS — Z7982 Long term (current) use of aspirin: Secondary | ICD-10-CM | POA: Diagnosis not present

## 2014-07-09 DIAGNOSIS — Z79899 Other long term (current) drug therapy: Secondary | ICD-10-CM | POA: Diagnosis not present

## 2014-07-09 DIAGNOSIS — W1839XA Other fall on same level, initial encounter: Secondary | ICD-10-CM | POA: Insufficient documentation

## 2014-07-09 DIAGNOSIS — R071 Chest pain on breathing: Secondary | ICD-10-CM | POA: Diagnosis not present

## 2014-07-09 DIAGNOSIS — I517 Cardiomegaly: Secondary | ICD-10-CM | POA: Diagnosis not present

## 2014-07-09 DIAGNOSIS — G309 Alzheimer's disease, unspecified: Secondary | ICD-10-CM | POA: Insufficient documentation

## 2014-07-09 DIAGNOSIS — S0990XA Unspecified injury of head, initial encounter: Secondary | ICD-10-CM | POA: Diagnosis not present

## 2014-07-09 DIAGNOSIS — Z8719 Personal history of other diseases of the digestive system: Secondary | ICD-10-CM | POA: Diagnosis not present

## 2014-07-09 DIAGNOSIS — I25119 Atherosclerotic heart disease of native coronary artery with unspecified angina pectoris: Secondary | ICD-10-CM | POA: Diagnosis not present

## 2014-07-09 DIAGNOSIS — Y998 Other external cause status: Secondary | ICD-10-CM | POA: Diagnosis not present

## 2014-07-09 DIAGNOSIS — F411 Generalized anxiety disorder: Secondary | ICD-10-CM | POA: Insufficient documentation

## 2014-07-09 DIAGNOSIS — W19XXXA Unspecified fall, initial encounter: Secondary | ICD-10-CM

## 2014-07-09 DIAGNOSIS — R531 Weakness: Secondary | ICD-10-CM | POA: Diagnosis not present

## 2014-07-09 DIAGNOSIS — F0281 Dementia in other diseases classified elsewhere with behavioral disturbance: Secondary | ICD-10-CM | POA: Diagnosis not present

## 2014-07-09 DIAGNOSIS — Y9389 Activity, other specified: Secondary | ICD-10-CM | POA: Insufficient documentation

## 2014-07-09 DIAGNOSIS — M859 Disorder of bone density and structure, unspecified: Secondary | ICD-10-CM | POA: Diagnosis not present

## 2014-07-09 LAB — TROPONIN I: Troponin I: <0.03 ng/mL (ref <0.031)

## 2014-07-09 MED ORDER — TRAMADOL HCL 50 MG PO TABS: 50.0000 mg | ORAL_TABLET | Freq: Once | ORAL | Status: DC

## 2014-07-09 MED ORDER — ACETAMINOPHEN 500 MG PO TABS: 500.0000 mg | ORAL_TABLET | Freq: Four times a day (QID) | ORAL | Status: AC | PRN

## 2014-07-09 MED ORDER — TRAMADOL HCL 50 MG PO TABS: 50.0000 mg | ORAL_TABLET | Freq: Two times a day (BID) | ORAL | Status: DC | PRN

## 2014-07-09 MED ORDER — FENTANYL CITRATE 0.05 MG/ML IJ SOLN: 50.0000 ug | Freq: Once | INTRAMUSCULAR | Status: DC

## 2014-07-09 NOTE — ED Provider Notes (Signed)
Medical screening examination/treatment/procedure(s) were conducted as a shared visit with non-physician practitioner(s) and myself.  I personally evaluated the patient during the encounter.   EKG Interpretation None      Patient seen by me. Patient as per EMS had 2 falls last night again today. Patient clearly has a history of dementia and is alert but confused. Level V caveat applies.  Family stated that they called the rescue squad because of the fall this morning patient was on the floor. Patient was alert and cooperative. There seemed to be some complaint of pain to the left side of her chest patient denies any of that now. Patient does not understand why she's here.  Patient's heart regular rate and rhythm lungs are clear bilaterally. Patient moving all 4 extremities.  Patient is a DO NOT RESUSCITATE and also has paperwork for comfort measures only.  Chest x-ray with rib series ordered. Trying to contact the family to see if they want any additional workup to include like urinalysis and some lab work.  Fredia Sorrow, MD 07/09/14 1200

## 2014-07-09 NOTE — ED Provider Notes (Signed)
CSN: [id number]     Arrival date & time 07/09/14  1055 History   First MD Initiated Contact with Patient 07/09/14 1100     Chief Complaint  Patient presents with  . Fall  . Chest Pain     (Consider location/radiation/quality/duration/timing/severity/associated sxs/prior Treatment) HPI Comments: Patient is a 81 year old female past medical history significant for Alzheimer's dementia, CAD, IBS presenting to the emergency department via home for evaluation of 3 falls. She lives at home with her son typically does not require use of a walker or cane. States last fall was today, found her on the ground she is complaining of left sided rib pain worsened with inspiration. Unsure of LOC. Patient is a level V caveat d/t Dementia. Patient is a DNR with CMO orders in place.   The history is provided by the EMS personnel and the patient. The history is limited by the condition of the patient.    Past Medical History  Diagnosis Date  . COLONIC POLYPS, ADENOMATOUS   . MITRAL REGURGITATION   . CORONARY, ARTERIOSCLEROSIS 05/1998    PCI BMS to mid LAD 05/1998, repeat cath in 02/07 revealed mild in-stent restenosis, otherwise patent)  . Irritable bowel syndrome   . ALZHEIMERS DISEASE   . ANXIETY   . HYPERCHOLESTEROLEMIA   . HERNIA, HIATAL, NONCONGENITAL   . Abnormal chest CT 04/2010    RUL nodule  . Angina   . H/O hiatal hernia    Past Surgical History  Procedure Laterality Date  . Coronary angioplasty with stent placement  06/04/1998    "1"; LAD  . Insert / replace / remove pacemaker  2007    initial placement; Medtronic  . Abdominal hysterectomy  1965  . Appendectomy  1965    w/hyster  . Cataract extraction w/ intraocular lens implant      left   Family History  Problem Relation Age of Onset  . Stroke Father   . Heart disease Mother   . Heart disease Father   . Heart disease Brother   . Heart disease Sister    History  Substance Use Topics  . Smoking status: Never Smoker   .  Smokeless tobacco: Current User    Types: Chew  . Alcohol Use: No   OB History    No data available     Review of Systems  Unable to perform ROS: Dementia      Allergies  Diazepam  Home Medications   Prior to Admission medications   Medication Sig Start Date End Date Taking? Authorizing Provider  acetaminophen (TYLENOL) 500 MG tablet Take 1 tablet (500 mg total) by mouth every 6 (six) hours as needed. 07/09/14   Stephani Police Henning Ehle, PA-C  aspirin 81 MG EC tablet Take 81 mg by mouth every evening.  12/04/11   Rowe Clack, MD  docusate sodium (COLACE) 100 MG capsule Take 100 mg by mouth every evening.     Historical Provider, MD  donepezil (ARICEPT) 10 MG tablet Take 1 tablet (10 mg total) by mouth at bedtime. 12/06/13   Rowe Clack, MD  LORazepam (ATIVAN) 0.5 MG tablet TAKE 1/2 TO 1 TABLETS BY MOUTH EVERY 8 HOURS AS NEEDED FOR ANXIETY/DIZZINESS 05/25/14   Biagio Borg, MD  memantine (NAMENDA) 5 MG tablet Take 1 tablet (5 mg total) by mouth 2 (two) times daily. 09/14/13   Rowe Clack, MD  Multiple Vitamin (MULTIVITAMIN WITH MINERALS) TABS Take 1 tablet by mouth every evening. One Daily for Women 50+  Adv Po    Historical Provider, MD  nitroGLYCERIN (NITROSTAT) 0.4 MG SL tablet Place 0.4 mg under the tongue every 5 (five) minutes x 3 doses as needed. For chest pain 07/16/11 01/12/13  Roger A Arguello, PA-C  PARoxetine (PAXIL) 20 MG tablet Take 1 tablet (20 mg total) by mouth every evening. 04/10/14   Rowe Clack, MD  Sulfamethoxazole-Trimethoprim (BACTRIM PO) Take by mouth 2 (two) times daily.    Historical Provider, MD  traMADol (ULTRAM) 50 MG tablet Take 1 tablet (50 mg total) by mouth every 12 (twelve) hours as needed for severe pain. 07/09/14   Ryin Schillo L Aseneth Hack, PA-C   BP 169/64 mmHg  Pulse 79  Temp(Src) 98.3 F (36.8 C) (Oral)  Resp 19  SpO2 97% Physical Exam  Constitutional: She appears well-developed and well-nourished. No distress.  HENT:    Head: Normocephalic and atraumatic.  Right Ear: External ear normal.  Left Ear: External ear normal.  Nose: Nose normal.  Mouth/Throat: Oropharynx is clear and moist. No oropharyngeal exudate.  Eyes: Conjunctivae are normal.  Neck: Normal range of motion. Neck supple.  Cardiovascular: Normal rate and regular rhythm.   Murmur heard. Pulmonary/Chest: Effort normal and breath sounds normal. No respiratory distress. She exhibits tenderness. She exhibits no edema, no deformity and no swelling.    Abdominal: Soft.  Musculoskeletal: Normal range of motion. She exhibits no tenderness.  Neurological: She is alert.  Patient follow commands without difficulty.  Skin: Skin is warm and dry. She is not diaphoretic.  Psychiatric: She has a normal mood and affect.  Nursing note and vitals reviewed.   ED Course  Procedures (including critical care time) Medications  fentaNYL (SUBLIMAZE) injection 50 mcg (0 mcg Intravenous Hold 07/09/14 1122)    Labs Review Labs Reviewed  TROPONIN I    Imaging Review Dg Ribs Unilateral W/chest Left  07/09/2014   CLINICAL DATA:  Acute posterior left rib pain and chest pain. Initial encounter.  EXAM: LEFT RIBS AND CHEST - 3+ VIEW  COMPARISON:  04/09/2012 and prior chest radiographs dating back to 07/09/2005  FINDINGS: Cardiomegaly and left-sided pacemaker again noted.  There is no evidence of focal airspace disease, pulmonary edema, suspicious pulmonary nodule/mass, pleural effusion, or pneumothorax. No acute bony abnormalities are identified.  Diffuse osteopenia is identified.  No definite acute left rib fracture or lesion noted.  IMPRESSION: Cardiomegaly without evidence of acute abnormality.  No definite acute left rib fracture or lesion.  Diffuse osteopenia.   Electronically Signed   By: Margarette Canada M.D.   On: 07/09/2014 12:50     EKG Interpretation   Date/Time:  Sunday July 09 2014 11:18:59 EST Ventricular Rate:  83 PR Interval:  148 QRS Duration:  95 QT Interval:  455 QTC Calculation: 535 R Axis:   43 Text Interpretation:  Atrial-paced complexes Multiple ventricular  premature complexes LVH with secondary repolarization abnormality Anterior  infarct, old Prolonged QT interval Confirmed by Rogene Houston  MD, [place]  [phone]) on 07/09/2014 12:11:44 PM        MDM   Final diagnoses:  Fall, initial encounter  Rib pain on left side    Filed Vitals:   07/09/14 1401  BP: 169/64  Pulse: 79  Temp: 98.3 F (36.8 C)  Resp: 19   Patient's daughter, Abel Presto, arrived to the ED, she is the power of attorney. Further discuss her mother's wishes for workup in the emergency department for falls. Daughter states that she is only requesting she be evaluated for the  rib pain, her pain be managed and evaluated for possible cardiac etiology. She declines any imaging of the head, noncardiac labs be drawn or urine obtained. She does state she has a walker at home that the patient can use.  Afebrile, NAD, non-toxic appearing, AAOx at baseline. I have reviewed nursing notes, vital signs, and all appropriate lab and imaging results for this patient. No acute physical examination findings. No bruising or deformity to chest wall noted. X-ray reviewed without acute rib fracture. EKG unchanged, troponin negative. Discussed Medtronic results with daughter, remaining battery life approximately 15 months. Will provide pain management for patient at home with symptomatic measures. Will discharge patient home. Return precautions discussed. Patient / Family / Caregiver informed of clinical course, understand medical decision-making and is agreeable to plan. Patient is stable at time of discharge. Patient d/w with Dr. Rogene Houston, agrees with plan.        Harlow Mares, PA-C 07/09/14 1550

## 2014-07-09 NOTE — ED Notes (Addendum)
Patient reported to fall 2 x  Last night and again today.  Patient resides at home with her son.  Unsure why she fell.  Patient denies pain except is to the left side/rib.  Patient noted to have unsteady gait per medics.  Patient was lying on her back when ems arrived.  Patient with no loc.  She has hx of dementia.  Patient is alert and cooperative.  She has no obvious injury except for the pain with palpation.  cbg 106, vs 140/76, 102hr, 18 resp, 99% on RA

## 2014-07-09 NOTE — Discharge Instructions (Signed)
Please follow up with your primary care physician in 1-2 days. If you do not have one please call the [facility] number listed above. Please take pain medication and/or muscle relaxants as prescribed and as needed for pain. Please do not drive on narcotic pain medication or on muscle relaxants. Please read all discharge instructions and return precautions.   Rib Contusion A rib contusion (bruise) can occur by a blow to the chest or by a fall against a hard object. Usually these will be much better in a couple weeks. If X-rays were taken today and there are no broken bones (fractures), the diagnosis of bruising is made. However, broken ribs may not show up for several days, or may be discovered later on a routine X-ray when signs of healing show up. If this happens to you, it does not mean that something was missed on the X-ray, but simply that it did not show up on the first X-rays. Earlier diagnosis will not usually change the treatment. HOME CARE INSTRUCTIONS   Avoid strenuous activity. Be careful during activities and avoid bumping the injured ribs. Activities that pull on the injured ribs and cause pain should be avoided, if possible.  For the first day or two, an ice pack used every 20 minutes while awake may be helpful. Put ice in a plastic bag and put a towel between the bag and the skin.  Eat a normal, well-balanced diet. Drink plenty of fluids to avoid constipation.  Take deep breaths several times a day to keep lungs free of infection. Try to cough several times a day. Splint the injured area with a pillow while coughing to ease pain. Coughing can help prevent pneumonia.  Wear a rib belt or binder only if told to do so by your caregiver. If you are wearing a rib belt or binder, you must do the breathing exercises as directed by your caregiver. If not used properly, rib belts or binders restrict breathing which can lead to pneumonia.  Only take over-the-counter or  prescription medicines for pain, discomfort, or fever as directed by your caregiver. SEEK MEDICAL CARE IF:   You or your child has an oral temperature above 102 F (38.9 C).  Your baby is older than 3 months with a rectal temperature of 100.5 F (38.1 C) or higher for more than 1 day.  You develop a cough, with thick or bloody sputum. SEEK IMMEDIATE MEDICAL CARE IF:   You have difficulty breathing.  You feel sick to your stomach (nausea), have vomiting or belly (abdominal) pain.  You have worsening pain, not controlled with medications, or there is a change in the location of the pain.  You develop sweating or radiation of the pain into the arms, jaw or shoulders, or become light headed or faint.  You or your child has an oral temperature above 102 F (38.9 C), not controlled by medicine.  Your or your baby is older than 3 months with a rectal temperature of 102 F (38.9 C) or higher.  Your baby is 18 months old or younger with a rectal temperature of 100.4 F (38 C) or higher. MAKE SURE YOU:   Understand these instructions.  Will watch your condition.  Will get help right away if you are not doing well or get worse. Document Released: 02/04/2001 Document Revised: 09/06/2012 Document Reviewed: 12/29/2007 [facility] Patient Information 2015 [place], [place]. This information is not intended to replace advice given to you by your health care provider.  Make sure you discuss any questions you have with your health care provider. ° °

## 2014-07-09 NOTE — ED Notes (Signed)
Lab at bedside

## 2014-07-10 ENCOUNTER — Telehealth: Payer: Self-pay | Admitting: Internal Medicine

## 2014-07-10 DIAGNOSIS — G309 Alzheimer's disease, unspecified: Secondary | ICD-10-CM

## 2014-07-10 DIAGNOSIS — R296 Repeated falls: Secondary | ICD-10-CM

## 2014-07-10 DIAGNOSIS — I340 Nonrheumatic mitral (valve) insufficiency: Secondary | ICD-10-CM

## 2014-07-10 DIAGNOSIS — R2681 Unsteadiness on feet: Secondary | ICD-10-CM

## 2014-07-10 DIAGNOSIS — R410 Disorientation, unspecified: Secondary | ICD-10-CM

## 2014-07-10 DIAGNOSIS — F0280 Dementia in other diseases classified elsewhere without behavioral disturbance: Secondary | ICD-10-CM

## 2014-07-10 NOTE — Telephone Encounter (Signed)
Daughter states patient has fallen a few times and has had to take patient to ER.  She is requesting a script for a potty chair and a wheel chair to be faxed to [facility] at [phone].  Daughter can also be reached at [phone].

## 2014-07-10 NOTE — Telephone Encounter (Signed)
Orders have been entered per PCP. Will be faxed to [facility] after PCP signs tomorrow.

## 2014-07-11 NOTE — Telephone Encounter (Signed)
This did not print and I am not able to reprint.  Can you see this in queue

## 2014-07-12 ENCOUNTER — Telehealth: Payer: Self-pay | Admitting: Internal Medicine

## 2014-07-12 NOTE — Telephone Encounter (Signed)
Patient was in ED on 07/09/2014. She was RX traMADol (ULTRAM) 50 MG tablet [[id number]] for ribs from a fall. Caller is asking for this to be refilled for the patient.

## 2014-07-13 MED ORDER — TRAMADOL HCL 50 MG PO TABS: 50.0000 mg | ORAL_TABLET | Freq: Two times a day (BID) | ORAL | Status: DC | PRN

## 2014-07-13 NOTE — Telephone Encounter (Signed)
rx faxed

## 2014-07-13 NOTE — Addendum Note (Signed)
Addended by: Gwendolyn Grant A on: 07/13/2014 12:14 PM   Modules accepted: Orders

## 2014-07-13 NOTE — Telephone Encounter (Signed)
Refill ok - please fax same

## 2014-07-19 ENCOUNTER — Emergency Department (HOSPITAL_COMMUNITY): Payer: Medicare Other

## 2014-07-19 ENCOUNTER — Telehealth: Payer: Self-pay | Admitting: Internal Medicine

## 2014-07-19 ENCOUNTER — Emergency Department (HOSPITAL_COMMUNITY)
Admission: EM | Admit: 2014-07-19 | Discharge: 2014-07-19 | Disposition: A | Discharge status: Home / Self Care | Payer: Medicare Other | Attending: Emergency Medicine | Admitting: Emergency Medicine

## 2014-07-19 ENCOUNTER — Encounter (HOSPITAL_COMMUNITY): Payer: Self-pay | Admitting: Emergency Medicine

## 2014-07-19 DIAGNOSIS — S2232XA Fracture of one rib, left side, initial encounter for closed fracture: Secondary | ICD-10-CM | POA: Insufficient documentation

## 2014-07-19 DIAGNOSIS — G309 Alzheimer's disease, unspecified: Secondary | ICD-10-CM | POA: Diagnosis not present

## 2014-07-19 DIAGNOSIS — W06XXXA Fall from bed, initial encounter: Secondary | ICD-10-CM | POA: Insufficient documentation

## 2014-07-19 DIAGNOSIS — F0280 Dementia in other diseases classified elsewhere without behavioral disturbance: Secondary | ICD-10-CM | POA: Insufficient documentation

## 2014-07-19 DIAGNOSIS — Z79899 Other long term (current) drug therapy: Secondary | ICD-10-CM | POA: Diagnosis not present

## 2014-07-19 DIAGNOSIS — F419 Anxiety disorder, unspecified: Secondary | ICD-10-CM | POA: Diagnosis not present

## 2014-07-19 DIAGNOSIS — I25799 Atherosclerosis of other coronary artery bypass graft(s) with unspecified angina pectoris: Secondary | ICD-10-CM | POA: Diagnosis not present

## 2014-07-19 DIAGNOSIS — F4489 Other dissociative and conversion disorders: Secondary | ICD-10-CM | POA: Diagnosis not present

## 2014-07-19 DIAGNOSIS — S299XXA Unspecified injury of thorax, initial encounter: Secondary | ICD-10-CM | POA: Diagnosis present

## 2014-07-19 DIAGNOSIS — Z950 Presence of cardiac pacemaker: Secondary | ICD-10-CM | POA: Insufficient documentation

## 2014-07-19 DIAGNOSIS — Y9289 Other specified places as the place of occurrence of the external cause: Secondary | ICD-10-CM | POA: Diagnosis not present

## 2014-07-19 DIAGNOSIS — Z9861 Coronary angioplasty status: Secondary | ICD-10-CM | POA: Diagnosis not present

## 2014-07-19 DIAGNOSIS — Z86010 Personal history of colonic polyps: Secondary | ICD-10-CM | POA: Insufficient documentation

## 2014-07-19 DIAGNOSIS — R259 Unspecified abnormal involuntary movements: Secondary | ICD-10-CM | POA: Diagnosis not present

## 2014-07-19 DIAGNOSIS — Y9389 Activity, other specified: Secondary | ICD-10-CM | POA: Insufficient documentation

## 2014-07-19 DIAGNOSIS — S2242XA Multiple fractures of ribs, left side, initial encounter for closed fracture: Secondary | ICD-10-CM | POA: Diagnosis not present

## 2014-07-19 DIAGNOSIS — S2239XA Fracture of one rib, unspecified side, initial encounter for closed fracture: Secondary | ICD-10-CM | POA: Diagnosis not present

## 2014-07-19 DIAGNOSIS — I6789 Other cerebrovascular disease: Secondary | ICD-10-CM | POA: Diagnosis not present

## 2014-07-19 DIAGNOSIS — S2231XA Fracture of one rib, right side, initial encounter for closed fracture: Secondary | ICD-10-CM

## 2014-07-19 DIAGNOSIS — Z8719 Personal history of other diseases of the digestive system: Secondary | ICD-10-CM | POA: Diagnosis not present

## 2014-07-19 DIAGNOSIS — Y998 Other external cause status: Secondary | ICD-10-CM | POA: Diagnosis not present

## 2014-07-19 LAB — URINALYSIS, ROUTINE W REFLEX MICROSCOPIC
GLUCOSE, UA: NEGATIVE mg/dL
Hgb urine dipstick: NEGATIVE
KETONES UR: NEGATIVE mg/dL
LEUKOCYTES UA: NEGATIVE
Nitrite: NEGATIVE
PROTEIN: NEGATIVE mg/dL
Specific Gravity, Urine: 1.019 (ref 1.005–1.030)
UROBILINOGEN UA: 1 mg/dL (ref 0.0–1.0)
pH: 6 (ref 5.0–8.0)

## 2014-07-19 MED ORDER — AZITHROMYCIN 250 MG PO TABS: ORAL_TABLET | ORAL | Status: DC

## 2014-07-19 NOTE — ED Notes (Signed)
Bed: WA06 Expected date:  Expected time:  Means of arrival:  Comments: EMS- elderly, fall, no complaints

## 2014-07-19 NOTE — Telephone Encounter (Signed)
PLEASE NOTE: All timestamps contained within this report are represented as [place] Standard Time. CONFIDENTIALTY NOTICE: This fax transmission is intended only for the addressee. It contains information that is legally privileged, confidential or otherwise protected from use or disclosure. If you are not the intended recipient, you are strictly prohibited from reviewing, disclosing, copying using or disseminating any of this information or taking any action in reliance on or regarding this information. If you have received this fax in error, please notify us immediately by telephone so that we can arrange for its return to [place]. Phone: [phone], Toll-Free: [phone], Fax: [phone] Page: 1 of 1 Call Id: [id number] [place] [facility] - Client [place] Patient Name: Anna [name] DOB: October 28, 1919 Initial Comment Caller states, mother has Alzheimer's she can't stand or walk. Now she has not been eating or drinking, she chokes on liquids won't eat food. It has been over a week since she could eat / drink normally Nurse Assessment Nurse: Donalynn Furlong, RN, Myna Hidalgo Date/Time Eilene Ghazi Time): 07/19/2014 11:09:34 AM Confirm and document reason for call. If symptomatic, describe symptoms. ---Caller states, mother has Alzheimer's she can't stand or walk. Now she has not been eating or drinking, she chokes on liquids won't eat food. It has been over a week since she could eat / drink normally Has the patient traveled out of the country within the last 30 days? ---Not Applicable Does the patient require triage? ---Yes Related visit to physician within the last 2 weeks? ---Yes Does the PT have any chronic conditions? (i.e. diabetes, asthma, etc.) ---Yes List chronic conditions. ---Alzheimer's Guidelines Guideline Title Affirmed Question Affirmed Notes Head Injury [1] ACUTE NEURO SYMPTOM AND [2] now fine (DEFINITION: difficult to awaken  OR confused thinking and talking OR slurred speech OR weakness of arms OR unsteady walking) Final Disposition User Go to ED Now (or PCP triage) Donalynn Furlong, RN, Myna Hidalgo

## 2014-07-19 NOTE — Telephone Encounter (Signed)
Patient states [facility] has not received orders for wheel chair.  She is also requesting there to be an order put in for a hospital bed.

## 2014-07-19 NOTE — Progress Notes (Signed)
CSW met with pt at bedside. Daughter was present. Daughter states that pt has alzheimers. Pt appeared to be confused. Pt was extremely hard to understand. Pt's sentences came out in a mumbling manner.  Daughter confirms that the pt comes to [facility] due to falling today. According to daughter, the pt attempted to get out of the bed to go to the bathroom and fell.  Daughter states that the pt cannot complete her ADL's independently. She says that she assist the pt with her ADL's. Daughter states that she and her brother live in the home with pt.   CSW asked daughter if she was interested in the pt going to a facility. However, the daughter declined the offer. Daughter states that she is able to take care of the pt at home. Daughter stated " She is going to go home." Daughter stated that she has spoken with Nurse CM regarding home health.  CSW asked daughter if she had any questions. However, she stated that she did not have any questions at this time.  [name] [phone] ED CSW 07/19/2014 8:21 PM

## 2014-07-19 NOTE — Telephone Encounter (Signed)
Patient needs an order for a hospital bed with rails.  Fax it to [employer] [phone]

## 2014-07-19 NOTE — ED Provider Notes (Signed)
CSN: [id number]     Arrival date & time 07/19/14  1244 History   First MD Initiated Contact with Patient 07/19/14 1248     Chief Complaint  Patient presents with  . Fall     (Consider location/radiation/quality/duration/timing/severity/associated sxs/prior Treatment) Patient is a 81 y.o. female presenting with fall.  Fall This is a recurrent problem. The current episode started 1 to 2 hours ago. Episode frequency: once. The problem has not changed since onset.Associated symptoms include chest pain (L sided). Pertinent negatives include no abdominal pain, no headaches and no shortness of breath. Exacerbated by: palpation. Nothing relieves the symptoms. She has tried nothing for the symptoms.    Past Medical History  Diagnosis Date  . COLONIC POLYPS, ADENOMATOUS   . MITRAL REGURGITATION   . CORONARY, ARTERIOSCLEROSIS 05/1998    PCI BMS to mid LAD 05/1998, repeat cath in 02/07 revealed mild in-stent restenosis, otherwise patent)  . Irritable bowel syndrome   . ALZHEIMERS DISEASE   . ANXIETY   . HYPERCHOLESTEROLEMIA   . HERNIA, HIATAL, NONCONGENITAL   . Abnormal chest CT 04/2010    RUL nodule  . Angina   . H/O hiatal hernia    Past Surgical History  Procedure Laterality Date  . Coronary angioplasty with stent placement  06/04/1998    "1"; LAD  . Insert / replace / remove pacemaker  2007    initial placement; [employer]  . Abdominal hysterectomy  1965  . Appendectomy  1965    w/hyster  . Cataract extraction w/ intraocular lens implant      left   Family History  Problem Relation Age of Onset  . Stroke Father   . Heart disease Mother   . Heart disease Father   . Heart disease Brother   . Heart disease Sister    History  Substance Use Topics  . Smoking status: Never Smoker   . Smokeless tobacco: Current User    Types: Chew  . Alcohol Use: No   OB History    No data available     Review of Systems  Unable to perform ROS: Dementia  Respiratory: Negative for  shortness of breath.   Cardiovascular: Positive for chest pain (L sided).  Gastrointestinal: Negative for abdominal pain.  Neurological: Negative for headaches.      Allergies  Diazepam  Home Medications   Prior to Admission medications   Medication Sig Start Date End Date Taking? Authorizing Provider  donepezil (ARICEPT) 10 MG tablet Take 1 tablet (10 mg total) by mouth at bedtime. 12/06/13  Yes Rowe Clack, MD  memantine (NAMENDA) 5 MG tablet Take 1 tablet (5 mg total) by mouth 2 (two) times daily. Patient taking differently: Take 5 mg by mouth at bedtime.  09/14/13  Yes Rowe Clack, MD  traMADol (ULTRAM) 50 MG tablet Take 1 tablet (50 mg total) by mouth every 12 (twelve) hours as needed for severe pain. 07/13/14  Yes Rowe Clack, MD  acetaminophen (TYLENOL) 500 MG tablet Take 1 tablet (500 mg total) by mouth every 6 (six) hours as needed. 07/09/14   Jennifer L Piepenbrink, PA-C  azithromycin (ZITHROMAX Z-PAK) 250 MG tablet 2 po day one, then 1 daily x 4 days 07/19/14   Debby Freiberg, MD  LORazepam (ATIVAN) 0.5 MG tablet TAKE 1/2 TO 1 TABLETS BY MOUTH EVERY 8 HOURS AS NEEDED FOR ANXIETY/DIZZINESS 05/25/14   Biagio Borg, MD  nitroGLYCERIN (NITROSTAT) 0.4 MG SL tablet Place 0.4 mg under the tongue every 5 (  five) minutes x 3 doses as needed. For chest pain 07/16/11 01/12/13  Roger A Arguello, PA-C  PARoxetine (PAXIL) 20 MG tablet Take 1 tablet (20 mg total) by mouth every evening. Patient not taking: Reported on 07/19/2014 04/10/14   Rowe Clack, MD   BP 171/61 mmHg  Pulse 65  Temp(Src) 97.1 F (36.2 C) (Oral)  Resp 18  SpO2 98% Physical Exam  Constitutional: She appears well-developed and well-nourished.  HENT:  Head: Normocephalic and atraumatic.  Right Ear: External ear normal.  Left Ear: External ear normal.  Eyes: Conjunctivae and EOM are normal. Pupils are equal, round, and reactive to light.  Neck: Normal range of motion. Neck supple.   Cardiovascular: Normal rate, regular rhythm, normal heart sounds and intact distal pulses.   Pulmonary/Chest: Effort normal and breath sounds normal. She exhibits tenderness (L chest wall) and bony tenderness.  Abdominal: Soft. Bowel sounds are normal. There is no tenderness.  Musculoskeletal: Normal range of motion.       Cervical back: Normal.       Thoracic back: Normal.       Lumbar back: Normal.  Neurological: She is alert. GCS eye subscore is 4. GCS verbal subscore is 4. GCS motor subscore is 6.  Skin: Skin is warm and dry.  Vitals reviewed.   ED Course  Procedures (including critical care time) Labs Review Labs Reviewed  URINALYSIS, ROUTINE W REFLEX MICROSCOPIC - Abnormal; Notable for the following:    Color, Urine AMBER (*)    Bilirubin Urine SMALL (*)    All other components within normal limits  URINE CULTURE    Imaging Review Dg Ribs Unilateral W/chest Left  07/19/2014   CLINICAL DATA:  Unwitnessed fall, Alzheimer's disease  EXAM: LEFT RIBS AND CHEST - 3+ VIEW  COMPARISON:  07/09/2014  FINDINGS: Chronic interstitial markings.  No focal consolidation.  Mild patchy left basilar opacity, likely atelectasis. No pleural effusion or pneumothorax.  The heart is normal in size.  Left subclavian pacemaker.  Minimally displaced left lateral 9th and 10th rib fractures.  IMPRESSION: Mild patchy left basilar opacity, likely atelectasis.  Minimally displaced left lateral 9th and 10th rib fractures.   Electronically Signed   By: Julian Hy M.D.   On: 07/19/2014 14:47     EKG Interpretation None      MDM   Final diagnoses:  Rib fracture, left, closed, initial encounter    81 y.o. female with pertinent PMH of alzheimers, CAD, IBS presents with recurrent fall from bed.  She has a baseline ho weakness and slid from bed.  Exam as above.  Pt asymptomatic unless palpated.  CXR with L rib fractures. Case management spoke with pt and have arranged for home health, as well as  equipment.  DC home in stable condition   I have reviewed all laboratory and imaging studies if ordered as above  1. Rib fracture, left, closed, initial encounter         Debby Freiberg, MD 07/20/14 [phone]

## 2014-07-19 NOTE — Telephone Encounter (Signed)
I will reprint and refax to [facility].

## 2014-07-19 NOTE — ED Notes (Signed)
No Trauma or injury noted from fall. Pt is at baseline for mentation with hx of dementia and alzheimer.

## 2014-07-19 NOTE — Discharge Instructions (Signed)

## 2014-07-19 NOTE — ED Notes (Signed)
Per EMS, pt from home had unwitnessed fall. Pt denies pain, family wanted pt evaluated. Pt has hx of alzheimer's and has a pacemaker. Pt unable to walk at baseline.

## 2014-07-19 NOTE — Telephone Encounter (Signed)
Anna [name] [phone]

## 2014-07-19 NOTE — Telephone Encounter (Signed)
LVM for pt daughter to call back.   P

## 2014-07-19 NOTE — Telephone Encounter (Signed)
Returning your call. °

## 2014-07-19 NOTE — Telephone Encounter (Signed)
LVM for pt daughter to call back.

## 2014-07-19 NOTE — Progress Notes (Signed)
  CARE MANAGEMENT ED NOTE 07/19/2014  Patient:  [name],[name]   Account Number:  [id number]  Date Initiated:  07/19/2014  Documentation initiated by:  Livia Snellen  Subjective/Objective Assessment:   Patient presents to Ed post fall with injury to left chest wall causing pain     Subjective/Objective Assessment Detail:   Patient with pomhx of alzheimer's disease, anxiety     Action/Plan:   Action/Plan Detail:   Anticipated DC Date:  07/19/2014     Status Recommendation to Physician:   Result of Recommendation:    Other ED Services  Consult [facility] [place]  CM consult  Other   [facility] Choice  HOME HEALTH   Choice offered to / List presented to:  C-4 Adult Children  DME arranged  HOSPITAL BED     DME agency  [place] arranged  HH-1 RN  [place]      [place].    Status of service:  Completed, signed off  ED Comments:   ED Comments Detail:  [facility] recieved consult for home health services.  EDCM spoke to patient and her daughter Anna [name] at bedside.  Patient's daughter reports patient lives at home with her son. Anna [name] has been staying with them also to help the patient with ADL's and meals.  Anna [name] reports patient has a walker, wheelchair and bedside commode at home.  Anna [name] is requesting a hospital bed at home.  Anna [name] confirms patient's pcp is Dr. Gwendolyn [name].  EDCM called Dr. Katheren [name] office and spoke to [place] at 1550pm who placed a request for an order for a hospital bed with rails to be faxed to [facility], [facility] supplied [facility] fax number.  Anna [name] reports she is very pleased with [facility] and has chosen them for home health services for the patient.  EDCM discussed patient with EDP who placed home health orders for RN, PT, OT, aide and Education officer, museum.  EDP also placed order for hospital bed for patient.  EDCM placed referral for [facility] services as well with  Martha's permission.  EDCM spoek to [place] of [facility] regarding referral for home health services and hospital bed order.  Anna [name] provided [facility] with phone number [phone] for [facility] to call for services.  Patient's daughter reports she has the contact information for [facility]. Patient's daughter thankful for services.  No further EDCM needs at this time.

## 2014-07-20 DIAGNOSIS — G308 Other Alzheimer's disease: Secondary | ICD-10-CM | POA: Diagnosis not present

## 2014-07-20 LAB — URINE CULTURE
Colony Count: NO GROWTH
Culture: NO GROWTH
SPECIAL REQUESTS: NORMAL

## 2014-07-20 NOTE — Telephone Encounter (Signed)
Hospital has ordered the DME hospital bed with rails.

## 2014-07-21 DIAGNOSIS — F419 Anxiety disorder, unspecified: Secondary | ICD-10-CM | POA: Diagnosis not present

## 2014-07-21 DIAGNOSIS — G301 Alzheimer's disease with late onset: Secondary | ICD-10-CM | POA: Diagnosis not present

## 2014-07-21 DIAGNOSIS — Z9181 History of falling: Secondary | ICD-10-CM | POA: Diagnosis not present

## 2014-07-21 DIAGNOSIS — I2510 Atherosclerotic heart disease of native coronary artery without angina pectoris: Secondary | ICD-10-CM | POA: Diagnosis not present

## 2014-07-21 NOTE — Progress Notes (Signed)
07/21/2014 A.Cheyne Bungert RNCM 1618pm EDCM called patient's daughter Joycelyn Schmid to follo wup with patient.  Joycelyn Schmid reports that patient has received her hospital bed with rails and that the visiting RN has come out to day.  Joycelyn Schmid is very thankful for [facility] services.  No further EDCM needs at this time.

## 2014-07-25 DIAGNOSIS — G301 Alzheimer's disease with late onset: Secondary | ICD-10-CM | POA: Diagnosis not present

## 2014-07-25 DIAGNOSIS — F419 Anxiety disorder, unspecified: Secondary | ICD-10-CM | POA: Diagnosis not present

## 2014-07-25 DIAGNOSIS — Z9181 History of falling: Secondary | ICD-10-CM | POA: Diagnosis not present

## 2014-07-25 DIAGNOSIS — I2510 Atherosclerotic heart disease of native coronary artery without angina pectoris: Secondary | ICD-10-CM | POA: Diagnosis not present

## 2014-07-26 DIAGNOSIS — I2510 Atherosclerotic heart disease of native coronary artery without angina pectoris: Secondary | ICD-10-CM | POA: Diagnosis not present

## 2014-07-26 DIAGNOSIS — Z9181 History of falling: Secondary | ICD-10-CM | POA: Diagnosis not present

## 2014-07-26 DIAGNOSIS — G301 Alzheimer's disease with late onset: Secondary | ICD-10-CM | POA: Diagnosis not present

## 2014-07-26 DIAGNOSIS — F419 Anxiety disorder, unspecified: Secondary | ICD-10-CM | POA: Diagnosis not present

## 2014-07-27 DIAGNOSIS — G301 Alzheimer's disease with late onset: Secondary | ICD-10-CM | POA: Diagnosis not present

## 2014-07-27 DIAGNOSIS — I2510 Atherosclerotic heart disease of native coronary artery without angina pectoris: Secondary | ICD-10-CM | POA: Diagnosis not present

## 2014-07-27 DIAGNOSIS — F419 Anxiety disorder, unspecified: Secondary | ICD-10-CM | POA: Diagnosis not present

## 2014-07-27 DIAGNOSIS — Z9181 History of falling: Secondary | ICD-10-CM | POA: Diagnosis not present

## 2014-07-29 ENCOUNTER — Telehealth: Payer: Self-pay | Admitting: Internal Medicine

## 2014-07-29 NOTE — Telephone Encounter (Signed)
The patient had not been able to urinate for a whole day.  The nurse came and the patient went in the potty chair.  The nurse left a clean catch jar and the daughter has collected the urine  The nurse has not returned to pick up the urine.  The daughter would like an antibiotic as the urine is dark and has floater and has a horrible  Smell.   [facility]

## 2014-07-31 ENCOUNTER — Telehealth: Payer: Self-pay | Admitting: *Deleted

## 2014-07-31 MED ORDER — AMPICILLIN 250 MG PO CAPS: 250.0000 mg | ORAL_CAPSULE | Freq: Three times a day (TID) | ORAL | Status: AC

## 2014-07-31 MED ORDER — MEMANTINE HCL 5 MG PO TABS: 5.0000 mg | ORAL_TABLET | Freq: Every day | ORAL | Status: AC

## 2014-07-31 NOTE — Telephone Encounter (Signed)
Ampicillin antibiotics TID x 7 d - erx done Please let caregiver know same thanks

## 2014-07-31 NOTE — Telephone Encounter (Signed)
LVM for pt to call back.

## 2014-07-31 NOTE — Telephone Encounter (Signed)
[facility] - Client [place] Patient Name: Anna [name] Gender: Female DOB: 08/29/1919 Age: 81 Y 96 M 11 D Return Phone Number: [phone] (Primary) Address: City/State/Zip: [place] Client [facility] - Client Client Site [place] - Night Physician [place], [place] Type Call Call Type Triage / Clinical Caller Name Joycelyn Schmid Relationship To Patient Daughter Return Phone Number [phone] (Primary) Chief Complaint Urination Pain Initial Comment Caller states her mother has a UTI. Would like meds called in. PreDisposition Did not know what to do Nurse Assessment Nurse: Shawn Stall, RN, Trish Date/Time (Eastern Time): 07/29/2014 10:56:24 AM Confirm and document reason for call. If symptomatic, describe symptoms. ---Daughter is calling for mom and caller states her mother has a UTI. Would like meds called in. Only voiding 1 time a day and home health nurse comes out. Home health nurse gave a urine cup and daughter collected cup of urine and has called home health nurse and no return call. Has the patient traveled out of the country within the last 30 days? ---No Does the patient require triage? ---Yes Related visit to physician within the last 2 weeks? ---Yes Does the PT have any chronic conditions? (i.e. diabetes, asthma, etc.) ---Yes List chronic conditions. ---Was seen in er 2 times in the last 2 weeks. has alztimers and is not able to walk. she fractured 3 hips on left side. hernia Guidelines Guideline Title Affirmed Question Affirmed Notes Nurse Date/Time Eilene Ghazi Time) Urination Pain - Female Side (flank) or lower back pain present Vernal, RN, Trish 07/29/2014 10:59:49 AM Disp. Time Eilene Ghazi Time) Disposition Final User 07/29/2014 11:03:53 AM See Physician within 4 Hours (or PCP triage) Yes Shawn Stall, RN, Trish Caller Understands: Yes Disagree/Comply:  Comply PLEASE NOTE: All timestamps contained within this report are represented as [place] Standard Time. CONFIDENTIALTY NOTICE: This fax transmission is intended only for the addressee. It contains information that is legally privileged, confidential or otherwise protected from use or disclosure. If you are not the intended recipient, you are strictly prohibited from reviewing, disclosing, copying using or disseminating any of this information or taking any action in reliance on or regarding this information. If you have received this fax in error, please notify us immediately by telephone so that we can arrange for its return to [place]. Phone: [phone], Toll-Free: [phone], Fax: [phone] Page: 2 of 2 Call Id: [id number] Care Advice Given Per Guideline SEE PHYSICIAN WITHIN 4 HOURS (or PCP triage): ACETAMINOPHEN (E.G., TYLENOL): * Take 650 mg (two 325 mg pills) by mouth every 4-6 hours as needed. Each Regular Strength Tylenol pill has 325 mg of acetaminophen. The most you should take each day is 3,250 mg (10 Regular Strength pills a day). CALL BACK IF: * You become worse. CARE ADVICE given per Urination Pain - Female (Adult) guideline. After Care Instructions Given Call Event Type User Date / Time Description Referrals REFERRED TO PCP OFFIC

## 2014-08-01 DIAGNOSIS — I2510 Atherosclerotic heart disease of native coronary artery without angina pectoris: Secondary | ICD-10-CM | POA: Diagnosis not present

## 2014-08-01 DIAGNOSIS — Z9181 History of falling: Secondary | ICD-10-CM | POA: Diagnosis not present

## 2014-08-01 DIAGNOSIS — F419 Anxiety disorder, unspecified: Secondary | ICD-10-CM | POA: Diagnosis not present

## 2014-08-01 DIAGNOSIS — G301 Alzheimer's disease with late onset: Secondary | ICD-10-CM | POA: Diagnosis not present

## 2014-08-03 ENCOUNTER — Encounter: Payer: Self-pay | Admitting: Internal Medicine

## 2014-08-03 ENCOUNTER — Ambulatory Visit (INDEPENDENT_AMBULATORY_CARE_PROVIDER_SITE_OTHER): Payer: Medicare Other | Admitting: Internal Medicine

## 2014-08-03 VITALS — BP 132/74 | HR 66 | Temp 97.6°F | Resp 14 | Ht 63.0 in

## 2014-08-03 DIAGNOSIS — S2232XS Fracture of one rib, left side, sequela: Secondary | ICD-10-CM

## 2014-08-03 DIAGNOSIS — R627 Adult failure to thrive: Secondary | ICD-10-CM

## 2014-08-03 DIAGNOSIS — R4182 Altered mental status, unspecified: Secondary | ICD-10-CM | POA: Diagnosis not present

## 2014-08-03 DIAGNOSIS — G309 Alzheimer's disease, unspecified: Secondary | ICD-10-CM

## 2014-08-03 DIAGNOSIS — F0280 Dementia in other diseases classified elsewhere without behavioral disturbance: Secondary | ICD-10-CM

## 2014-08-03 DIAGNOSIS — W19XXXS Unspecified fall, sequela: Secondary | ICD-10-CM

## 2014-08-03 NOTE — Patient Instructions (Signed)
It was good to see you today.  We have reviewed your prior records including labs and tests today  Medications reviewed and updated, no changes recommended at this time. Ok to crush tramadol as needed for pain and complete antibiotics as prescribed Refill on medication(s) as discussed today.  we'll make referral to  [facility] Our office will contact you regarding appointment(s) once made.  Please schedule followup as needed - we will be in touch!

## 2014-08-03 NOTE — Progress Notes (Signed)
Pre visit review using our clinic review tool, if applicable. No additional management support is needed unless otherwise documented below in the visit note. 

## 2014-08-03 NOTE — Progress Notes (Signed)
Subjective:    Patient ID: Anna [name], female    DOB: 08/07/1919, 81 y.o.   MRN: [id number]  HPI  Patient here for followup Progressive decline since 07/09/14 reviewed: advance dementia with agitation at baseline, s/p fall 2/14 x 2, resulting ED eval because of head impact and L side chest pain. Pt with decreased verbal participation, decreased intake of food and water and decreased mobility, esp LLE since DC home from ED. Another fall on 2/24 with increasing L chest pain prompting return to ED where L rib fx confirmed on CXR. Home with tylenol and tx for atx with azith.   Interval phone notes with this office - tx of presumed UTI - family reports little improvement in mobility, intake or return to baseline despite abx and encourgaement.  Family feels ready for hospice given 6 wk decline. Declines other labs, xray. dnies need for alt pain control given efficacy with tramadol  Past Medical History  Diagnosis Date  . COLONIC POLYPS, ADENOMATOUS   . MITRAL REGURGITATION   . CORONARY, ARTERIOSCLEROSIS 05/1998    PCI BMS to mid LAD 05/1998, repeat cath in 02/07 revealed mild in-stent restenosis, otherwise patent)  . Irritable bowel syndrome   . ALZHEIMERS DISEASE     advanced  . ANXIETY   . HYPERCHOLESTEROLEMIA   . HERNIA, HIATAL, NONCONGENITAL   . Abnormal chest CT 04/2010    RUL nodule  . Angina   . H/O hiatal hernia     Review of Systems  Constitutional: Positive for activity change, appetite change and fatigue. Negative for fever.  Respiratory: Negative for apnea and shortness of breath.   Cardiovascular: Positive for chest pain (L ribs @ fx).  Endocrine: Negative for polydipsia, polyphagia and polyuria.  Genitourinary: Positive for decreased urine volume (<1 void/24h). Negative for urgency and frequency.  Musculoskeletal: Positive for gait problem (less use of LLE since 2/14 fall).  Allergic/Immunologic: Positive for food allergies.  Psychiatric/Behavioral: Positive for  confusion. Negative for behavioral problems. Agitation: occassional, but no use/need for lorazepam since 2/14 due to pt subdued mental status.       Objective:    Physical Exam  Constitutional: No distress.  Elderly, withdrawn woman but will verbally interact with family in response to questions. Slow, single word responses. Dtr and g-dtr Linus Orn at side  Cardiovascular: Normal rate, regular rhythm and normal heart sounds.   Pulmonary/Chest: Effort normal. No respiratory distress. She has no wheezes. She has no rales. She exhibits tenderness.  Abdominal: Soft. Bowel sounds are normal. There is no tenderness.  Musculoskeletal:  No reproducible pain over palpation of lower spine, pelvis, anterior hips, lateral bursa or femur BLE. No gross deformity. Pt assisted to standing position with family 2+ assist. While being fully supported for weight bearing, small/tiny stride, shuffle gait forward with use of RLE, pulling LLE forward  Skin: She is not diaphoretic.  No bruise or lacerations    BP 132/74 mmHg  Pulse 66  Temp(Src) 97.6 F (36.4 C) (Oral)  Resp 14  Ht 5\' 3"  (1.6 m)  Wt   SpO2 91% Wt Readings from Last 3 Encounters:  06/09/14 120 lb 12.8 oz (54.795 kg)  10/14/13 120 lb 12.8 oz (54.795 kg)  06/01/13 121 lb 6 oz (55.055 kg)     Lab Results  Component Value Date   WBC 3.7* 06/09/2014   HGB 13.8 06/09/2014   HCT 41.9 06/09/2014   PLT 222.0 06/09/2014   GLUCOSE 90 06/09/2014   CHOL 220* 09/18/2011  TRIG 49.0 09/18/2011   HDL 86.00 09/18/2011   LDLDIRECT 119.9 09/18/2011   LDLCALC * 05/12/2010    113        Total Cholesterol/HDL:CHD Risk Coronary Heart Disease Risk Table                     Men   Women  1/2 Average Risk   3.4   3.3  Average Risk       5.0   4.4  2 X Average Risk   9.6   7.1  3 X Average Risk  23.4   11.0        Use the calculated Patient Ratio above and the CHD Risk Table to determine the patient's CHD Risk.        ATP III CLASSIFICATION  (LDL):  <100     mg/dL   Optimal  100-129  mg/dL   Near or Above                    Optimal  130-159  mg/dL   Borderline  160-189  mg/dL   High  >190     mg/dL   Very High   ALT 16 12/15/2012   AST 21 12/15/2012   NA 142 06/09/2014   K 3.6 06/09/2014   CL 105 06/09/2014   CREATININE 0.85 06/09/2014   BUN 10 06/09/2014   CO2 30 06/09/2014   TSH 4.68 11/30/2012   INR 0.97 07/15/2011   HGBA1C  05/11/2010    5.3 (NOTE)                                                                       According to the ADA Clinical Practice Recommendations for 2011, when HbA1c is used as a screening test:   >=6.5%   Diagnostic of Diabetes Mellitus           (if abnormal result  is confirmed)  5.7-6.4%   Increased risk of developing Diabetes Mellitus  References:Diagnosis and Classification of Diabetes Mellitus,Diabetes NTZG,0174,94(WHQPR 1):S62-S69 and Standards of Medical Care in         Diabetes - 2011,Diabetes Care,2011,34  (Suppl 1):S11-S61.    Dg Ribs Unilateral W/chest Left  07/19/2014   CLINICAL DATA:  Unwitnessed fall, Alzheimer's disease  EXAM: LEFT RIBS AND CHEST - 3+ VIEW  COMPARISON:  07/09/2014  FINDINGS: Chronic interstitial markings.  No focal consolidation.  Mild patchy left basilar opacity, likely atelectasis. No pleural effusion or pneumothorax.  The heart is normal in size.  Left subclavian pacemaker.  Minimally displaced left lateral 9th and 10th rib fractures.  IMPRESSION: Mild patchy left basilar opacity, likely atelectasis.  Minimally displaced left lateral 9th and 10th rib fractures.   Electronically Signed   By: Julian Hy M.D.   On: 07/19/2014 14:47       Assessment & Plan:   Problem List Items Addressed This Visit    Altered mental status   Relevant Orders   Ambulatory referral to [facility] - [facility]   Relevant Orders   Ambulatory referral to Hospice    Other Visit Diagnoses    Fall with significant injury, sequela        Relevant Orders  Ambulatory referral to Hospice    Left rib fracture, sequela        Relevant Orders    Ambulatory referral to Hospice    FTT (failure to thrive) in adult        Relevant Orders    Ambulatory referral to Hospice      Advanced dementia in 81 yo woman with 6 week decline/FTT following multiple falls.  Decreased mobility, intake since same, not improved with abx for UTI/atx/URI and pain mgmt of identified L rib fx.   Discussed possible SDH or ICH related to falls with neuro decline (Mental status and MSkel) and family declines need for same. Also no evidence for unidentified fx (vertebral, hip or pelvic) based on my physical exam.  Family rquests focus on comfort in accordance with previously states goals. Will refer to home hospice for assist with meeting these intended goals Reports pain controlled with tramadol - continue same  Time spent with pt/family today 30 minutes, greater than 50% time spent counseling patient on dementia, FTT and plans to enlist home hospice for decline - also medication review and r review of prior records   Gwendolyn Grant, MD

## 2014-08-04 ENCOUNTER — Other Ambulatory Visit: Payer: Self-pay | Admitting: Internal Medicine

## 2014-08-04 NOTE — Telephone Encounter (Signed)
Faxed script back to [facility].../lmb 

## 2014-08-04 NOTE — Telephone Encounter (Signed)
MD out of office pls advise on refill.../lmb 

## 2014-08-07 ENCOUNTER — Telehealth: Payer: Self-pay

## 2014-08-07 NOTE — Telephone Encounter (Signed)
[facility] called. Needed OV notes and ED notes to complete the referral. These have been printed and faxed. They have scheduled someone to go out there today.

## 2014-08-08 ENCOUNTER — Telehealth: Payer: Self-pay | Admitting: Internal Medicine

## 2014-08-08 MED ORDER — MORPHINE SULFATE (CONCENTRATE) 20 MG/ML PO SOLN: 5.0000 mg | ORAL | Status: AC | PRN

## 2014-08-08 NOTE — Telephone Encounter (Signed)
Called sonya and informed that an rx was sent to [facility]. She said thank you but also wanted a rx to the local pharmacy for the pt. Stated that the pt needs it a little sooner than the mail order can get it to her.  rx printed, signed and faxed.

## 2014-08-08 NOTE — Telephone Encounter (Signed)
Anna [name] called from hospice in [facility] stated Mrs. Wiggs just came on with them and they need Rx for Morphine sulfate concentrate 20 mg or mL with direction of 0.25 mL by mouth or under the tongue every 3 hour for pain as needed. Please call her back, she was hopping to get this done today.

## 2014-08-10 ENCOUNTER — Telehealth: Payer: Self-pay | Admitting: Internal Medicine

## 2014-08-14 ENCOUNTER — Telehealth: Payer: Self-pay

## 2014-08-14 NOTE — Telephone Encounter (Signed)
Noted Thanks for the message I called to give my support to Joycelyn Schmid (dtr) who was appreciative of call

## 2014-08-14 NOTE — Telephone Encounter (Signed)
Received cremation death certificate 7/94/80 from [place].  Will take to Dr. Asa Lente for signature.  Left with nurse for signature tomorrow 08/15/14.  Dr. Asa Lente is out of office until then.  Certificate not signed.  Will hold and take back up 08/16/14.  Received back 08/17/14.  Faxed and called for pick-up.

## 2014-08-17 NOTE — Telephone Encounter (Signed)
Joycelyn Schmid called in and wanted to check status of death certificate.  She said that funeral home has sent it and they are just waiting

## 2014-08-21 ENCOUNTER — Telehealth: Payer: Self-pay | Admitting: Internal Medicine

## 2014-08-21 NOTE — Telephone Encounter (Signed)
Mailed Supplemental Orders to [facility] on 08/21/2014

## 2014-08-21 NOTE — Telephone Encounter (Signed)
Spoke to MD. Stated that it has been signed and sent back to [facility] This was completed last week.

## 2014-08-25 NOTE — Telephone Encounter (Signed)
Daughter called in and said that pt has passed this morning

## 2014-08-25 DEATH — deceased
